# Patient Record
Sex: Male | Born: 1945 | Race: White | Hispanic: No | Marital: Married | Smoking: Never smoker
Health system: Southern US, Community
[De-identification: ages and names within clinical notes are randomized; demographics above are authoritative.]

## PROBLEM LIST (undated history)

## (undated) DIAGNOSIS — R972 Elevated prostate specific antigen [PSA]: Secondary | ICD-10-CM

## (undated) DIAGNOSIS — C61 Malignant neoplasm of prostate: Secondary | ICD-10-CM

## (undated) DIAGNOSIS — K5730 Diverticulosis of large intestine without perforation or abscess without bleeding: Secondary | ICD-10-CM

## (undated) DIAGNOSIS — Z86010 Personal history of colonic polyps: Secondary | ICD-10-CM

## (undated) DIAGNOSIS — R030 Elevated blood-pressure reading, without diagnosis of hypertension: Secondary | ICD-10-CM

## (undated) DIAGNOSIS — N400 Enlarged prostate without lower urinary tract symptoms: Secondary | ICD-10-CM

## (undated) DIAGNOSIS — R351 Nocturia: Secondary | ICD-10-CM

## (undated) DIAGNOSIS — I10 Essential (primary) hypertension: Secondary | ICD-10-CM

## (undated) DIAGNOSIS — Z8669 Personal history of other diseases of the nervous system and sense organs: Secondary | ICD-10-CM

## (undated) HISTORY — PX: COLONOSCOPY: SHX174

## (undated) HISTORY — DX: Personal history of colonic polyps: Z86.010

## (undated) HISTORY — PX: CATARACT EXTRACTION: SUR2

---

## 2005-03-02 ENCOUNTER — Ambulatory Visit: Payer: Self-pay | Admitting: Internal Medicine

## 2005-03-16 ENCOUNTER — Ambulatory Visit: Payer: Self-pay | Admitting: Internal Medicine

## 2005-12-12 ENCOUNTER — Emergency Department: Payer: Self-pay | Admitting: Internal Medicine

## 2008-04-19 HISTORY — PX: TRANSURETHRAL RESECTION OF PROSTATE: SHX73

## 2010-07-25 ENCOUNTER — Emergency Department (INDEPENDENT_AMBULATORY_CARE_PROVIDER_SITE_OTHER): Payer: Medicare Other

## 2010-07-25 ENCOUNTER — Emergency Department (HOSPITAL_BASED_OUTPATIENT_CLINIC_OR_DEPARTMENT_OTHER)
Admission: EM | Admit: 2010-07-25 | Discharge: 2010-07-26 | Disposition: A | Discharge status: Nursing Home (Includes ICF) | Payer: Medicare Other | Source: Home / Self Care | Attending: Emergency Medicine | Admitting: Emergency Medicine

## 2010-07-25 DIAGNOSIS — J3489 Other specified disorders of nose and nasal sinuses: Secondary | ICD-10-CM | POA: Insufficient documentation

## 2010-07-25 DIAGNOSIS — G459 Transient cerebral ischemic attack, unspecified: Secondary | ICD-10-CM | POA: Insufficient documentation

## 2010-07-25 DIAGNOSIS — R209 Unspecified disturbances of skin sensation: Secondary | ICD-10-CM | POA: Insufficient documentation

## 2010-07-25 DIAGNOSIS — R221 Localized swelling, mass and lump, neck: Secondary | ICD-10-CM

## 2010-07-25 DIAGNOSIS — R220 Localized swelling, mass and lump, head: Secondary | ICD-10-CM

## 2010-07-25 DIAGNOSIS — R29810 Facial weakness: Secondary | ICD-10-CM

## 2010-07-25 DIAGNOSIS — X58XXXA Exposure to other specified factors, initial encounter: Secondary | ICD-10-CM | POA: Insufficient documentation

## 2010-07-25 DIAGNOSIS — S058X9A Other injuries of unspecified eye and orbit, initial encounter: Secondary | ICD-10-CM | POA: Insufficient documentation

## 2010-07-25 DIAGNOSIS — H9209 Otalgia, unspecified ear: Secondary | ICD-10-CM | POA: Insufficient documentation

## 2010-07-25 LAB — BASIC METABOLIC PANEL
BUN: 17 mg/dL (ref 6–23)
CO2: 30 mEq/L (ref 19–32)
Chloride: 105 mEq/L (ref 96–112)
Potassium: 4.2 mEq/L (ref 3.5–5.1)

## 2010-07-25 LAB — CBC
HCT: 38 % — ABNORMAL LOW (ref 39.0–52.0)
MCH: 30 pg (ref 26.0–34.0)
MCHC: 34.2 g/dL (ref 30.0–36.0)
MCV: 87.6 fL (ref 78.0–100.0)
RDW: 12.5 % (ref 11.5–15.5)
WBC: 6 10*3/uL (ref 4.0–10.5)

## 2010-07-25 LAB — DIFFERENTIAL
Eosinophils Relative: 8 % — ABNORMAL HIGH (ref 0–5)
Lymphocytes Relative: 35 % (ref 12–46)
Lymphs Abs: 2.1 10*3/uL (ref 0.7–4.0)
Monocytes Absolute: 0.6 10*3/uL (ref 0.1–1.0)
Monocytes Relative: 10 % (ref 3–12)

## 2010-07-25 LAB — APTT: aPTT: 29 seconds (ref 24–37)

## 2010-07-26 ENCOUNTER — Emergency Department (HOSPITAL_COMMUNITY)
Admission: EM | Admit: 2010-07-26 | Discharge: 2010-07-26 | Disposition: A | Discharge status: Other Acute Inpatient Hospital | Payer: Medicare Other | Attending: Emergency Medicine | Admitting: Emergency Medicine

## 2010-07-26 ENCOUNTER — Other Ambulatory Visit (HOSPITAL_BASED_OUTPATIENT_CLINIC_OR_DEPARTMENT_OTHER): Payer: Self-pay

## 2010-07-26 ENCOUNTER — Emergency Department (HOSPITAL_COMMUNITY): Payer: Medicare Other

## 2010-07-26 DIAGNOSIS — H5789 Other specified disorders of eye and adnexa: Secondary | ICD-10-CM | POA: Insufficient documentation

## 2010-07-26 DIAGNOSIS — G459 Transient cerebral ischemic attack, unspecified: Secondary | ICD-10-CM | POA: Insufficient documentation

## 2010-07-26 DIAGNOSIS — G510 Bell's palsy: Secondary | ICD-10-CM | POA: Insufficient documentation

## 2010-07-26 DIAGNOSIS — I6529 Occlusion and stenosis of unspecified carotid artery: Secondary | ICD-10-CM | POA: Insufficient documentation

## 2010-07-26 DIAGNOSIS — R220 Localized swelling, mass and lump, head: Secondary | ICD-10-CM | POA: Insufficient documentation

## 2010-07-26 DIAGNOSIS — X58XXXA Exposure to other specified factors, initial encounter: Secondary | ICD-10-CM | POA: Insufficient documentation

## 2010-07-26 DIAGNOSIS — S058X9A Other injuries of unspecified eye and orbit, initial encounter: Secondary | ICD-10-CM | POA: Insufficient documentation

## 2010-07-26 DIAGNOSIS — I658 Occlusion and stenosis of other precerebral arteries: Secondary | ICD-10-CM | POA: Insufficient documentation

## 2010-07-26 DIAGNOSIS — R209 Unspecified disturbances of skin sensation: Secondary | ICD-10-CM | POA: Insufficient documentation

## 2010-07-26 DIAGNOSIS — R221 Localized swelling, mass and lump, neck: Secondary | ICD-10-CM | POA: Insufficient documentation

## 2010-07-26 DIAGNOSIS — R4789 Other speech disturbances: Secondary | ICD-10-CM | POA: Insufficient documentation

## 2010-07-26 DIAGNOSIS — H5710 Ocular pain, unspecified eye: Secondary | ICD-10-CM | POA: Insufficient documentation

## 2010-07-26 DIAGNOSIS — R29810 Facial weakness: Secondary | ICD-10-CM | POA: Insufficient documentation

## 2010-07-26 HISTORY — PX: TRANSTHORACIC ECHOCARDIOGRAM: SHX275

## 2010-07-26 LAB — CK TOTAL AND CKMB (NOT AT ARMC)
Relative Index: 1.9 (ref 0.0–2.5)
Total CK: 169 U/L (ref 7–232)

## 2010-07-26 LAB — COMPREHENSIVE METABOLIC PANEL
AST: 24 U/L (ref 0–37)
Albumin: 3.5 g/dL (ref 3.5–5.2)
Calcium: 8.9 mg/dL (ref 8.4–10.5)
Chloride: 106 mEq/L (ref 96–112)
Creatinine, Ser: 1.01 mg/dL (ref 0.4–1.5)
GFR calc Af Amer: >60 mL/min (ref >60)

## 2010-07-26 LAB — URINALYSIS, ROUTINE W REFLEX MICROSCOPIC
Glucose, UA: NEGATIVE mg/dL
Hgb urine dipstick: NEGATIVE
Ketones, ur: NEGATIVE mg/dL
pH: 7.5 (ref 5.0–8.0)

## 2010-07-26 LAB — LIPID PANEL
LDL Cholesterol: 139 mg/dL — ABNORMAL HIGH (ref 0–99)
Total CHOL/HDL Ratio: 3.5 RATIO
Triglycerides: 59 mg/dL (ref <150)
VLDL: 12 mg/dL (ref 0–40)

## 2010-07-26 MED ORDER — IOHEXOL 300 MG/ML  SOLN: 100.0000 mL | Freq: Once | INTRAMUSCULAR | Status: AC | PRN

## 2010-07-26 MED ORDER — IOHEXOL 300 MG/ML  SOLN: 100.0000 mL | Freq: Once | INTRAMUSCULAR | Status: DC | PRN

## 2011-09-24 ENCOUNTER — Other Ambulatory Visit: Payer: Self-pay | Admitting: Urology

## 2011-10-08 ENCOUNTER — Encounter (HOSPITAL_BASED_OUTPATIENT_CLINIC_OR_DEPARTMENT_OTHER): Payer: Self-pay | Admitting: *Deleted

## 2011-10-08 NOTE — Progress Notes (Signed)
NPO AFTER MN. ARRIVES AT 0645. NEEDS HG AND EKG. WILL DO FLEET ENEMA AM OF SURG.

## 2011-10-14 ENCOUNTER — Ambulatory Visit (HOSPITAL_BASED_OUTPATIENT_CLINIC_OR_DEPARTMENT_OTHER)
Admission: RE | Admit: 2011-10-14 | Discharge: 2011-10-14 | Disposition: A | Discharge status: Home / Self Care | Payer: Medicare Other | Source: Ambulatory Visit | Attending: Urology | Admitting: Urology

## 2011-10-14 ENCOUNTER — Ambulatory Visit (HOSPITAL_BASED_OUTPATIENT_CLINIC_OR_DEPARTMENT_OTHER): Payer: Medicare Other | Admitting: Anesthesiology

## 2011-10-14 ENCOUNTER — Encounter (HOSPITAL_BASED_OUTPATIENT_CLINIC_OR_DEPARTMENT_OTHER)
Admission: RE | Disposition: A | Discharge status: Home / Self Care | Payer: Self-pay | Source: Ambulatory Visit | Attending: Urology

## 2011-10-14 ENCOUNTER — Encounter (HOSPITAL_BASED_OUTPATIENT_CLINIC_OR_DEPARTMENT_OTHER): Payer: Self-pay | Admitting: Anesthesiology

## 2011-10-14 ENCOUNTER — Other Ambulatory Visit: Payer: Self-pay

## 2011-10-14 ENCOUNTER — Encounter (HOSPITAL_BASED_OUTPATIENT_CLINIC_OR_DEPARTMENT_OTHER): Payer: Self-pay | Admitting: *Deleted

## 2011-10-14 DIAGNOSIS — N400 Enlarged prostate without lower urinary tract symptoms: Secondary | ICD-10-CM | POA: Insufficient documentation

## 2011-10-14 DIAGNOSIS — R972 Elevated prostate specific antigen [PSA]: Secondary | ICD-10-CM | POA: Insufficient documentation

## 2011-10-14 DIAGNOSIS — L4050 Arthropathic psoriasis, unspecified: Secondary | ICD-10-CM

## 2011-10-14 DIAGNOSIS — R350 Frequency of micturition: Secondary | ICD-10-CM | POA: Insufficient documentation

## 2011-10-14 HISTORY — DX: Personal history of other diseases of the nervous system and sense organs: Z86.69

## 2011-10-14 HISTORY — PX: PROSTATE BIOPSY: SHX241

## 2011-10-14 HISTORY — DX: Elevated prostate specific antigen (PSA): R97.20

## 2011-10-14 SURGERY — BIOPSY, PROSTATE, RECTAL APPROACH, WITH US GUIDANCE
Anesthesia: Monitor Anesthesia Care | Site: Prostate | Wound class: Clean Contaminated

## 2011-10-14 MED ORDER — ACETAMINOPHEN 10 MG/ML IV SOLN: 1000.0000 mg | Freq: Four times a day (QID) | INTRAVENOUS | Status: DC

## 2011-10-14 MED ORDER — PROPOFOL 10 MG/ML IV EMUL
INTRAVENOUS | Status: DC | PRN
  Administered 2011-10-14 (×2): 20 mg via INTRAVENOUS

## 2011-10-14 MED ORDER — ACETAMINOPHEN 650 MG RE SUPP: 650.0000 mg | RECTAL | Status: DC | PRN

## 2011-10-14 MED ORDER — OXYCODONE HCL 5 MG PO TABS: 5.0000 mg | ORAL_TABLET | ORAL | Status: DC | PRN

## 2011-10-14 MED ORDER — MIDAZOLAM HCL 5 MG/5ML IJ SOLN
INTRAMUSCULAR | Status: DC | PRN
  Administered 2011-10-14: 2 mg via INTRAVENOUS
  Administered 2011-10-14 (×2): 1 mg via INTRAVENOUS

## 2011-10-14 MED ORDER — FENTANYL CITRATE 0.05 MG/ML IJ SOLN
INTRAMUSCULAR | Status: DC | PRN
  Administered 2011-10-14 (×4): 25 ug via INTRAVENOUS

## 2011-10-14 MED ORDER — KETOROLAC TROMETHAMINE 30 MG/ML IJ SOLN
INTRAMUSCULAR | Status: DC | PRN
  Administered 2011-10-14: 30 mg via INTRAVENOUS

## 2011-10-14 MED ORDER — CEFAZOLIN SODIUM 1-5 GM-% IV SOLN: 1.0000 g | INTRAVENOUS | Status: DC

## 2011-10-14 MED ORDER — ACETAMINOPHEN 325 MG PO TABS: 650.0000 mg | ORAL_TABLET | ORAL | Status: DC | PRN

## 2011-10-14 MED ORDER — LACTATED RINGERS IV SOLN: INTRAVENOUS | Status: DC

## 2011-10-14 MED ORDER — SODIUM CHLORIDE 0.45 % IV SOLN: INTRAVENOUS | Status: DC

## 2011-10-14 MED ORDER — ONDANSETRON HCL 4 MG/2ML IJ SOLN: 4.0000 mg | Freq: Four times a day (QID) | INTRAMUSCULAR | Status: DC | PRN

## 2011-10-14 MED ORDER — LACTATED RINGERS IV SOLN
INTRAVENOUS | Status: DC | PRN
  Administered 2011-10-14: 08:00:00 via INTRAVENOUS

## 2011-10-14 MED ORDER — PROMETHAZINE HCL 25 MG/ML IJ SOLN: 6.2500 mg | INTRAMUSCULAR | Status: DC | PRN

## 2011-10-14 MED ORDER — ONDANSETRON HCL 4 MG/2ML IJ SOLN
INTRAMUSCULAR | Status: DC | PRN
  Administered 2011-10-14: 4 mg via INTRAVENOUS

## 2011-10-14 MED ORDER — ACETAMINOPHEN 10 MG/ML IV SOLN
INTRAVENOUS | Status: DC | PRN
  Administered 2011-10-14: 1000 mg via INTRAVENOUS

## 2011-10-14 MED ORDER — SODIUM CHLORIDE 0.9 % IJ SOLN: 3.0000 mL | INTRAMUSCULAR | Status: DC | PRN

## 2011-10-14 MED ORDER — HYDROCODONE-ACETAMINOPHEN 7.5-650 MG PO TABS: 1.0000 | ORAL_TABLET | Freq: Four times a day (QID) | ORAL | Status: AC | PRN

## 2011-10-14 MED ORDER — PHENAZOPYRIDINE HCL 200 MG PO TABS: 200.0000 mg | ORAL_TABLET | Freq: Three times a day (TID) | ORAL | Status: AC | PRN

## 2011-10-14 MED ORDER — FENTANYL CITRATE 0.05 MG/ML IJ SOLN: 25.0000 ug | INTRAMUSCULAR | Status: DC | PRN

## 2011-10-14 MED ORDER — MEPERIDINE HCL 25 MG/ML IJ SOLN: 6.2500 mg | INTRAMUSCULAR | Status: DC | PRN

## 2011-10-14 MED ORDER — PROPOFOL 10 MG/ML IV EMUL
INTRAVENOUS | Status: DC | PRN
  Administered 2011-10-14: 100 ug/kg/min via INTRAVENOUS

## 2011-10-14 MED ORDER — SODIUM CHLORIDE 0.9 % IJ SOLN: 3.0000 mL | Freq: Two times a day (BID) | INTRAMUSCULAR | Status: DC

## 2011-10-14 MED ORDER — SODIUM CHLORIDE 0.9 % IV SOLN: 250.0000 mL | INTRAVENOUS | Status: DC | PRN

## 2011-10-14 SURGICAL SUPPLY — 10 items
DRESSING TELFA 8X3 (GAUZE/BANDAGES/DRESSINGS) ×2 IMPLANT
GLOVE BIO SURGEON STRL SZ7.5 (GLOVE) ×1 IMPLANT
GLOVE ECLIPSE 7.0 STRL STRAW (GLOVE) IMPLANT
GLOVE INDICATOR 7.5 STRL GRN (GLOVE) IMPLANT
IV NS IRRIG 3000ML ARTHROMATIC (IV SOLUTION) IMPLANT
SURGILUBE 2OZ TUBE FLIPTOP (MISCELLANEOUS) ×2 IMPLANT
TOWEL OR 17X24 6PK STRL BLUE (TOWEL DISPOSABLE) ×1 IMPLANT
UNDERPAD 30X30 INCONTINENT (UNDERPADS AND DIAPERS) ×4 IMPLANT
WATER STERILE IRR 3000ML UROMA (IV SOLUTION) IMPLANT
WATER STERILE IRR 500ML POUR (IV SOLUTION) ×1 IMPLANT

## 2011-10-14 NOTE — Op Note (Signed)
Pre-operative diagnosis : Elevated psa  Postoperative diagnosis:same  Operation:Transrectal u/s and biopsy  Surgeon:  S. Patsi Sears, MD  First assistant:none  Anesthesia: IV sedation  Preparation: After appropriate preanesthesia, the patient was brought to the operating room, and placed on the operating table in the left lateral decubitus position. IV sedation was given. Timeout was observed and armband was rechecked.  Review history: 63cc gland in pt with rising psa, now 16.   Statement of  Likelihood of Success: Excellent. TIME-OUT observed.:  Procedure: With the prostate ultrasound in position, ultrasound showed a 62.95 cc gland. A median lobe was identified.  12 core biopsies were then obtained without bleeding. The patient tolerated the procedure well. Tissue was sent to laboratory for examination. The patient received IV Tylenol prior to the procedure. He also received IV Ancef, 1 g.

## 2011-10-14 NOTE — Transfer of Care (Signed)
Immediate Anesthesia Transfer of Care Note  Patient: Jeremy Ruiz  Procedure(s) Performed: Procedure(s) (LRB): BIOPSY TRANSRECTAL ULTRASONIC PROSTATE (TUBP) (N/A)  Patient Location: PACU  Anesthesia Type: General  Level of Consciousness: awake, sedated, patient cooperative and responds to stimulation  Airway & Oxygen Therapy: Patient Spontanous Breathing and Patient connected to face mask oxygen  Post-op Assessment: Report given to PACU RN, Post -op Vital signs reviewed and stable and Patient moving all extremities  Post vital signs: Reviewed and stable  Complications: No apparent anesthesia complications

## 2011-10-14 NOTE — H&P (Signed)
  blems  1. PSA,Elevated 790.93 2. Urinary Frequency 788.41  History of Present Illness       66 yo married male Jeremy Ruiz, returns today for 6 mo f/u with a hx of elevated PSA.  Originally referred by Dr. Ilsa Iha for elevated PSA evaluation. Patient has an intermittent urinary flow & nocturia x 1-2.  He has had a diagnostic TURP in 2008. No family hx of CaP.  Today's IPSS = 2/7.  Current PSA 16.17. 02/24/11  PSA - 11.53/12%  08/26/10  PSA - 11.93/9%  08/03/10  PSA - 11.11  10/30/08  PSA - 10.22   Past Medical History Problems  1. History of  Arthritis V13.4  Surgical History Problems  1. History of  Transurethral Resection Of Prostate (TURP)  Current Meds 1. Multi-Day Vitamins TABS; TAKE 1 TABLET DAILY; Therapy: (Recorded:09May2012) to  Allergies Medication  1. No Known Drug Allergies  Family History Problems  1. Family history of  Family Health Status Number Of Children 1 son 2. Family history of  Father Deceased At Age ____ 81/Black Lung 3. Family history of  Mother Deceased At Age ____ 88/Stroke 4. Maternal history of  Stroke Syndrome V17.1  Social History Problems  1. Caffeine Use 4 per day 2. Marital History - Currently Married 3. Never A Smoker 4. 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 History of  Alcohol Use  Review of Systems Genitourinary, constitutional, skin, eye, otolaryngeal, hematologic/lymphatic, cardiovascular, pulmonary, endocrine, musculoskeletal, gastrointestinal, neurological and psychiatric system(s) were reviewed and pertinent findings if present are noted.    Vitals Vital Signs [Data Includes: Last 1 Day]  21May2013 10:31AM  BMI Calculated: 25.92 BSA Calculated: 1.91 Height: 5 ft 8 in Weight: 171 lb  Blood Pressure: 159 / 105 Temperature: 98 F Heart Rate: 71  Physical Exam Rectal: Rectal exam demonstrates normal sphincter tone, no tenderness and no masses. Estimated prostate size is 3+. Normal rectal tone, no rectal masses, prostate is smooth,  symmetric and non-tender. The prostate has no nodularity and is not tender. The left seminal vesicle is nonpalpable. The right seminal vesicle is nonpalpable. The perineum is normal on inspection.    Results/Data Urine [Data Includes: Last 1 Day]   21May2013  COLOR YELLOW   APPEARANCE CLEAR   SPECIFIC GRAVITY <1.005   pH 5.5   GLUCOSE NEG mg/dL  BILIRUBIN NEG   KETONE NEG mg/dL  BLOOD NEG   PROTEIN NEG mg/dL  UROBILINOGEN 0.2 mg/dL  NITRITE NEG   LEUKOCYTE ESTERASE NEG    Assessment Assessed  1. PSA,Elevated 790.93 2. Urinary Frequency 788.41   Rectal exam shows large but smooth gland. He is doing well.   Plan PSA,Elevated (790.93)  1. PSA  Requested for: 21May2013 2. PSA REFLEX TO FREE  Requested for: 21May2013 3. Follow-up Year x 1 Office  Follow-up  Requested for: 21May2013 Urinary Frequency (788.41), PSA,Elevated (790.93)  4. PSA REFLEX TO FREE  Requested for: 21May2013   RTC for psa 1 year.   Signatures

## 2011-10-14 NOTE — Anesthesia Postprocedure Evaluation (Signed)
  Anesthesia Post-op Note  Patient: Jeremy Ruiz  Procedure(s) Performed: Procedure(s) (LRB): BIOPSY TRANSRECTAL ULTRASONIC PROSTATE (TUBP) (N/A)  Patient Location: PACU  Anesthesia Type: MAC  Level of Consciousness: awake and alert   Airway and Oxygen Therapy: Patient Spontanous Breathing  Post-op Pain: mild  Post-op Assessment: Post-op Vital signs reviewed, Patient's Cardiovascular Status Stable, Respiratory Function Stable, Patent Airway and No signs of Nausea or vomiting  Post-op Vital Signs: stable  Complications: No apparent anesthesia complications

## 2011-10-14 NOTE — Anesthesia Preprocedure Evaluation (Addendum)
Anesthesia Evaluation  Patient identified by MRN, date of birth, ID band Patient awake    Reviewed: Allergy & Precautions, H&P , NPO status , Patient's Chart, lab work & pertinent test results  Airway Mallampati: II TM Distance: >3 FB Neck ROM: Full    Dental No notable dental hx.    Pulmonary neg pulmonary ROS,  breath sounds clear to auscultation  Pulmonary exam normal       Cardiovascular negative cardio ROS  Rhythm:Regular Rate:Normal     Neuro/Psych negative neurological ROS  negative psych ROS   GI/Hepatic negative GI ROS, Neg liver ROS,   Endo/Other  negative endocrine ROS  Renal/GU negative Renal ROS  negative genitourinary   Musculoskeletal negative musculoskeletal ROS (+)   Abdominal   Peds negative pediatric ROS (+)  Hematology negative hematology ROS (+)   Anesthesia Other Findings   Reproductive/Obstetrics negative OB ROS                           Anesthesia Physical Anesthesia Plan  ASA: I  Anesthesia Plan: MAC   Post-op Pain Management:    Induction: Intravenous  Airway Management Planned:   Additional Equipment:   Intra-op Plan:   Post-operative Plan:   Informed Consent: I have reviewed the patients History and Physical, chart, labs and discussed the procedure including the risks, benefits and alternatives for the proposed anesthesia with the patient or authorized representative who has indicated his/her understanding and acceptance.   Dental advisory given  Plan Discussed with: CRNA  Anesthesia Plan Comments:        Anesthesia Quick Evaluation

## 2011-10-14 NOTE — Interval H&P Note (Signed)
History and Physical Interval Note:  10/14/2011 8:11 AM  Jeremy Ruiz  has presented today for surgery, with the diagnosis of Elevated Prostate Specific Antigen  The various methods of treatment have been discussed with the patient and family. After consideration of risks, benefits and other options for treatment, the patient has consented to  Procedure(s) (LRB): BIOPSY TRANSRECTAL ULTRASONIC PROSTATE (TUBP) (N/A) as a surgical intervention .  The patient's history has been reviewed, patient examined, no change in status, stable for surgery.  I have reviewed the patients' chart and labs.  Questions were answered to the patient's satisfaction.     Jethro Bolus I

## 2011-10-14 NOTE — Progress Notes (Signed)
Pt's Hg 14.9

## 2011-10-14 NOTE — Discharge Instructions (Signed)

## 2011-10-15 ENCOUNTER — Encounter (HOSPITAL_BASED_OUTPATIENT_CLINIC_OR_DEPARTMENT_OTHER): Payer: Self-pay | Admitting: Urology

## 2011-10-19 ENCOUNTER — Encounter (HOSPITAL_BASED_OUTPATIENT_CLINIC_OR_DEPARTMENT_OTHER): Payer: Self-pay

## 2011-11-10 MED ORDER — CEFAZOLIN SODIUM 1-5 GM-% IV SOLN
INTRAVENOUS | Status: DC | PRN
  Administered 2011-10-14: 2 g via INTRAVENOUS

## 2011-11-10 NOTE — Addendum Note (Signed)
Addendum  created 11/10/11 1132 by Yaris Ferrell D Jashaun Penrose, CRNA   Modules edited:Anesthesia Events, Anesthesia Medication Administration    

## 2011-11-10 NOTE — Addendum Note (Signed)
Addendum  created 11/10/11 1132 by Jewel Baize Romie Tay, CRNA   Modules edited:Anesthesia Events, Anesthesia Medication Administration

## 2014-06-07 ENCOUNTER — Other Ambulatory Visit (HOSPITAL_COMMUNITY): Payer: Self-pay | Admitting: Urology

## 2014-06-07 DIAGNOSIS — R972 Elevated prostate specific antigen [PSA]: Secondary | ICD-10-CM

## 2014-06-21 ENCOUNTER — Ambulatory Visit (HOSPITAL_COMMUNITY)
Admission: RE | Admit: 2014-06-21 | Discharge: 2014-06-21 | Disposition: A | Discharge status: Home / Self Care | Payer: Medicare Other | Source: Ambulatory Visit | Attending: Urology | Admitting: Urology

## 2014-06-21 DIAGNOSIS — R972 Elevated prostate specific antigen [PSA]: Secondary | ICD-10-CM | POA: Diagnosis not present

## 2014-06-21 LAB — POCT I-STAT CREATININE: Creatinine, Ser: 1.1 mg/dL (ref 0.50–1.35)

## 2014-06-21 MED ORDER — GADOBENATE DIMEGLUMINE 529 MG/ML IV SOLN
20.0000 mL | Freq: Once | INTRAVENOUS | Status: AC | PRN
  Administered 2014-06-21: 16 mL via INTRAVENOUS

## 2015-03-31 ENCOUNTER — Encounter: Payer: Self-pay | Admitting: Internal Medicine

## 2015-04-22 ENCOUNTER — Encounter: Payer: Self-pay | Admitting: Internal Medicine

## 2015-06-02 ENCOUNTER — Ambulatory Visit (AMBULATORY_SURGERY_CENTER): Payer: Self-pay

## 2015-06-02 VITALS — Ht 68.0 in | Wt 170.0 lb

## 2015-06-02 DIAGNOSIS — Z1211 Encounter for screening for malignant neoplasm of colon: Secondary | ICD-10-CM

## 2015-06-02 NOTE — Progress Notes (Signed)
No egg or soy allergies Not on home 02 No previous anesthesia complications No diet or weight loss meds 

## 2015-06-11 DIAGNOSIS — I10 Essential (primary) hypertension: Secondary | ICD-10-CM | POA: Diagnosis not present

## 2015-06-11 DIAGNOSIS — N3001 Acute cystitis with hematuria: Secondary | ICD-10-CM | POA: Diagnosis not present

## 2015-06-11 DIAGNOSIS — R972 Elevated prostate specific antigen [PSA]: Secondary | ICD-10-CM | POA: Diagnosis not present

## 2015-06-12 ENCOUNTER — Telehealth: Payer: Self-pay | Admitting: Internal Medicine

## 2015-06-12 NOTE — Telephone Encounter (Signed)
Patient notified that he should keep his procedure as scheduled.  He is advised that as long as he does not have fever he may proceed as scheduled.

## 2015-06-13 ENCOUNTER — Telehealth: Payer: Self-pay | Admitting: Internal Medicine

## 2015-06-13 NOTE — Telephone Encounter (Signed)
No charge. 

## 2015-06-16 ENCOUNTER — Encounter: Payer: Medicare Other | Admitting: Internal Medicine

## 2015-07-03 DIAGNOSIS — H35371 Puckering of macula, right eye: Secondary | ICD-10-CM | POA: Diagnosis not present

## 2015-08-13 ENCOUNTER — Encounter: Payer: Medicare Other | Admitting: Internal Medicine

## 2015-08-21 ENCOUNTER — Telehealth: Payer: Self-pay | Admitting: Internal Medicine

## 2015-08-21 NOTE — Telephone Encounter (Signed)
Pt is scheduled for colonoscopy 09/11/14 with Dr. Carlean Purl. Pt had PV for procedure scheduled and cancelled for 2/27.  PV scheduled for 5/18 to review instructions for 5/25 colonoscopy.

## 2015-09-01 ENCOUNTER — Encounter: Payer: Medicare Other | Admitting: Internal Medicine

## 2015-09-04 ENCOUNTER — Ambulatory Visit (AMBULATORY_SURGERY_CENTER): Payer: Self-pay

## 2015-09-04 VITALS — Ht 68.0 in | Wt 168.6 lb

## 2015-09-04 DIAGNOSIS — Z1211 Encounter for screening for malignant neoplasm of colon: Secondary | ICD-10-CM

## 2015-09-04 NOTE — Progress Notes (Signed)
No allergies to eggs or soy No past problems with anesthesia No diet meds No home oxygen  Declined emmi 

## 2015-09-11 ENCOUNTER — Ambulatory Visit (AMBULATORY_SURGERY_CENTER): Payer: Medicare Other | Admitting: Internal Medicine

## 2015-09-11 ENCOUNTER — Encounter: Payer: Self-pay | Admitting: Internal Medicine

## 2015-09-11 VITALS — BP 133/78 | HR 60 | Temp 98.2°F | Resp 11 | Ht 68.0 in | Wt 168.0 lb

## 2015-09-11 DIAGNOSIS — I10 Essential (primary) hypertension: Secondary | ICD-10-CM | POA: Diagnosis not present

## 2015-09-11 DIAGNOSIS — D123 Benign neoplasm of transverse colon: Secondary | ICD-10-CM | POA: Diagnosis not present

## 2015-09-11 DIAGNOSIS — D120 Benign neoplasm of cecum: Secondary | ICD-10-CM

## 2015-09-11 DIAGNOSIS — Z1211 Encounter for screening for malignant neoplasm of colon: Secondary | ICD-10-CM | POA: Diagnosis not present

## 2015-09-11 DIAGNOSIS — D125 Benign neoplasm of sigmoid colon: Secondary | ICD-10-CM

## 2015-09-11 DIAGNOSIS — D122 Benign neoplasm of ascending colon: Secondary | ICD-10-CM | POA: Diagnosis not present

## 2015-09-11 MED ORDER — SODIUM CHLORIDE 0.9 % IV SOLN: 500.0000 mL | INTRAVENOUS | Status: DC

## 2015-09-11 NOTE — Op Note (Signed)
[facility] Patient Name: Jeremy Ruiz Procedure Date: 09/11/2015 9:55 AM MRN: [id number] Endoscopist: Gatha Mayer , MD Age: 70 Referring MD:  Date of Birth: 04-19-1946 Gender: Male Procedure:                Colonoscopy Indications:              Screening for colorectal malignant neoplasm (last                            colonoscopy was more than 10 years ago) Medicines:                Propofol per Anesthesia, Monitored Anesthesia Care Procedure:                Pre-Anesthesia Assessment:                           - Prior to the procedure, a History and Physical                            was performed, and patient medications and                            allergies were reviewed. The patient's tolerance of                            previous anesthesia was also reviewed. The risks                            and benefits of the procedure and the sedation                            options and risks were discussed with the patient.                            All questions were answered, and informed consent                            was obtained. Prior Anticoagulants: The patient has                            taken no previous anticoagulant or antiplatelet                            agents. ASA Grade Assessment: II - A patient with                            mild systemic disease. After reviewing the risks                            and benefits, the patient was deemed in                            satisfactory condition to undergo the procedure.  After obtaining informed consent, the colonoscope                            was passed under direct vision. Throughout the                            procedure, the patient's blood pressure, pulse, and                            oxygen saturations were monitored continuously. The                            Model CF-HQ190L [id number]) scope was introduced                            through the anus  and advanced to the the cecum,                            identified by appendiceal orifice and ileocecal                            valve. The colonoscopy was performed without                            difficulty. The patient tolerated the procedure                            well. The quality of the bowel preparation was                            excellent. The bowel preparation used was Miralax.                            The ileocecal valve, appendiceal orifice, and                            rectum were photographed. Scope In: 9:59:18 AM Scope Out: 10:15:20 AM Scope Withdrawal Time: 0 hours 14 minutes 23 seconds  Total Procedure Duration: 0 hours 16 minutes 2 seconds  Findings:                 The perianal examination was normal.                           The digital rectal exam findings include enlarged                            prostate. Pertinent negatives include no palpable                            rectal lesions.                           Two sessile polyps were found in the sigmoid colon  and ascending colon. The polyps were 4 to 10 mm in                            size. These polyps were removed with a cold snare.                            Resection and retrieval were complete. Verification                            of patient identification for the specimen was                            done. Estimated blood loss was minimal.                           Two sessile polyps were found in the transverse                            colon and cecum. The polyps were 2 mm in size.                            These polyps were removed with a cold biopsy                            forceps. Resection and retrieval were complete.                            Verification of patient identification for the                            specimen was done. Estimated blood loss was minimal.                           Many diverticula were found in the sigmoid colon.                             There was narrowing of the colon in association                            with the diverticular opening.                           The exam was otherwise without abnormality on                            direct and retroflexion views. Complications:            No immediate complications. Estimated Blood Loss:     Estimated blood loss was minimal. Impression:               - Enlarged prostate found on digital rectal exam.                           - Two 4 to 10  mm polyps in the sigmoid colon and in                            the ascending colon, removed with a cold snare.                            Resected and retrieved.                           - Two 2 mm polyps in the transverse colon and in                            the cecum, removed with a cold biopsy forceps.                            Resected and retrieved.                           - Severe diverticulosis in the sigmoid colon. There                            was narrowing of the colon in association with the                            diverticular opening.                           - The examination was otherwise normal on direct                            and retroflexion views.                           - Enlarged prostate found on digital rectal exam. Recommendation:           - Patient has a contact number available for                            emergencies. The signs and symptoms of potential                            delayed complications were discussed with the                            patient. Return to normal activities tomorrow.                            Written discharge instructions were provided to the                            patient.                           - Resume previous diet.                           -  Continue present medications.                           - Repeat colonoscopy is recommended. The                            colonoscopy date will be determined after  pathology                            results from today's exam become available for                            review. Gatha Mayer, MD 09/11/2015 10:22:50 AM This report has been signed electronically.

## 2015-09-11 NOTE — Progress Notes (Signed)
Report to PACU, RN, vss, BBS= Clear.  

## 2015-09-11 NOTE — Patient Instructions (Addendum)
I found and removed 4 polyps this time. You also have a condition called diverticulosis - common and not usually a problem. Please read the handout provided.  I will let you know pathology results and when to have another routine colonoscopy by mail.  I appreciate the opportunity to care for you. Gatha Mayer, MD, FACG  YOU HAD AN ENDOSCOPIC PROCEDURE TODAY AT [facility]:   Refer to the procedure report that was given to you for any specific questions about what was found during the examination.  If the procedure report does not answer your questions, please call your gastroenterologist to clarify.  If you requested that your care partner not be given the details of your procedure findings, then the procedure report has been included in a sealed envelope for you to review at your convenience later.  YOU SHOULD EXPECT: Some feelings of bloating in the abdomen. Passage of more gas than usual.  Walking can help get rid of the air that was put into your GI tract during the procedure and reduce the bloating. If you had a lower endoscopy (such as a colonoscopy or flexible sigmoidoscopy) you may notice spotting of blood in your stool or on the toilet paper. If you underwent a bowel prep for your procedure, you may not have a normal bowel movement for a few days.  Please Note:  You might notice some irritation and congestion in your nose or some drainage.  This is from the oxygen used during your procedure.  There is no need for concern and it should clear up in a day or so.  SYMPTOMS TO REPORT IMMEDIATELY:   Following lower endoscopy (colonoscopy or flexible sigmoidoscopy):  Excessive amounts of blood in the stool  Significant tenderness or worsening of abdominal pains  Swelling of the abdomen that is new, acute  Fever of 100F or higher  For urgent or emergent issues, a gastroenterologist can be reached at any hour by calling [phone].  DIET: Your first meal  following the procedure should be a small meal and then it is ok to progress to your normal diet. Heavy or fried foods are harder to digest and may make you feel nauseous or bloated.  Likewise, meals heavy in dairy and vegetables can increase bloating.  Drink plenty of fluids but you should avoid alcoholic beverages for 24 hours.  ACTIVITY:  You should plan to take it easy for the rest of today and you should NOT DRIVE or use heavy machinery until tomorrow (because of the sedation medicines used during the test).    FOLLOW UP: Our staff will call the number listed on your records the next business day following your procedure to check on you and address any questions or concerns that you may have regarding the information given to you following your procedure. If we do not reach you, we will leave a message.  However, if you are feeling well and you are not experiencing any problems, there is no need to return our call.  We will assume that you have returned to your regular daily activities without incident.  If any biopsies were taken you will be contacted by phone or by letter within the next 1-3 weeks.  Please call us at [phone] if you have not heard about the biopsies in 3 weeks.   SIGNATURES/CONFIDENTIALITY: You and/or your care partner have signed paperwork which will be entered into your electronic medical record.  These signatures attest to the fact  that that the information above on your After Visit Summary has been reviewed and is understood.  Full responsibility of the confidentiality of this discharge information lies with you and/or your care-partner.  Please read over handouts about polyps and diverticulosis  Please continue your normal medications  Await pathology

## 2015-09-11 NOTE — Progress Notes (Signed)
Called to room to assist during endoscopic procedure.  Patient ID and intended procedure confirmed with present staff. Received instructions for my participation in the procedure from the performing physician.  

## 2015-09-12 ENCOUNTER — Telehealth: Payer: Self-pay | Admitting: *Deleted

## 2015-09-12 DIAGNOSIS — Z860101 Personal history of adenomatous and serrated colon polyps: Secondary | ICD-10-CM

## 2015-09-12 DIAGNOSIS — Z86010 Personal history of colonic polyps: Secondary | ICD-10-CM | POA: Insufficient documentation

## 2015-09-12 HISTORY — DX: Personal history of colonic polyps: Z86.010

## 2015-09-12 HISTORY — DX: Personal history of adenomatous and serrated colon polyps: Z86.0101

## 2015-09-12 NOTE — Telephone Encounter (Signed)
  Follow up Call-  Call back number 09/11/2015  Post procedure Call Back phone  # #[phone] hm  Permission to leave phone message Yes     Patient questions:  Do you have a fever, pain , or abdominal swelling? No. Pain Score  0 *  Have you tolerated food without any problems? Yes.    Have you been able to return to your normal activities? Yes.    Do you have any questions about your discharge instructions: Diet   No. Medications  No. Follow up visit  No.  Do you have questions or concerns about your Care? No.  Actions: * If pain score is 4 or above: No action needed, pain <4.

## 2015-09-22 ENCOUNTER — Encounter: Payer: Self-pay | Admitting: Internal Medicine

## 2015-09-22 NOTE — Progress Notes (Signed)
Quick Note:  2 adenomas + ssp max size 10 mm recall 2020 ______

## 2016-02-13 DIAGNOSIS — D225 Melanocytic nevi of trunk: Secondary | ICD-10-CM | POA: Diagnosis not present

## 2016-02-13 DIAGNOSIS — L821 Other seborrheic keratosis: Secondary | ICD-10-CM | POA: Diagnosis not present

## 2016-02-13 DIAGNOSIS — Z1283 Encounter for screening for malignant neoplasm of skin: Secondary | ICD-10-CM | POA: Diagnosis not present

## 2016-03-01 DIAGNOSIS — R05 Cough: Secondary | ICD-10-CM | POA: Diagnosis not present

## 2016-03-09 DIAGNOSIS — R972 Elevated prostate specific antigen [PSA]: Secondary | ICD-10-CM | POA: Diagnosis not present

## 2016-03-10 ENCOUNTER — Other Ambulatory Visit: Payer: Self-pay | Admitting: Urology

## 2016-03-10 DIAGNOSIS — R972 Elevated prostate specific antigen [PSA]: Secondary | ICD-10-CM

## 2016-03-29 ENCOUNTER — Ambulatory Visit
Admission: RE | Admit: 2016-03-29 | Discharge: 2016-03-29 | Disposition: A | Discharge status: Home / Self Care | Payer: Medicare Other | Source: Ambulatory Visit | Attending: Urology | Admitting: Urology

## 2016-03-29 DIAGNOSIS — R972 Elevated prostate specific antigen [PSA]: Secondary | ICD-10-CM | POA: Diagnosis not present

## 2016-03-29 MED ORDER — GADOBENATE DIMEGLUMINE 529 MG/ML IV SOLN
15.0000 mL | Freq: Once | INTRAVENOUS | Status: AC | PRN
  Administered 2016-03-29: 15 mL via INTRAVENOUS

## 2016-04-27 ENCOUNTER — Other Ambulatory Visit: Payer: Self-pay | Admitting: Urology

## 2016-04-28 ENCOUNTER — Other Ambulatory Visit (HOSPITAL_COMMUNITY): Payer: Self-pay | Admitting: Urology

## 2016-04-28 DIAGNOSIS — R972 Elevated prostate specific antigen [PSA]: Secondary | ICD-10-CM

## 2016-05-05 ENCOUNTER — Encounter (HOSPITAL_BASED_OUTPATIENT_CLINIC_OR_DEPARTMENT_OTHER): Payer: Self-pay | Admitting: *Deleted

## 2016-05-05 NOTE — Progress Notes (Addendum)
NPO AFTER MN.  ARRIVE AT 0915.  NEEDS ISTAT 8 AND EKG.  WILL DO FLEET ENEMA AM DOS.

## 2016-05-06 NOTE — H&P (Signed)
Office Visit Report     03/09/2016   --------------------------------------------------------------------------------   Otila Back. Bivens  MRN: [id number]  PRIMARY CARE:  Thornell Mule, MD  DOB: Jul 06, 1945, 71 year old Male  REFERRING:    SSN: [id number]  PROVIDER:  Carolan Clines, M.D.    LOCATION:  [facility] [phone]   --------------------------------------------------------------------------------   CC: Elevated PSA-Established Patient  HPI: Jeremy Ruiz is a 71 year-old male established patient who is here for follow up for further evaluation of his elevated PSA.  His last PSA was performed 03/02/2016. The last PSA value was 35.92. The patient states he does not take 5 alpha reductase inhibitor medication.   He has had a prostate biopsy done. Patient does not have a family history of prostate cancer. The patient complains of lower urinary tract symptom(s) that include frequency, urgency, hesitancy, weak stream, intermittency, straining, nocturia, and sense of incomplete emptying.   MRI on 06/21/14 showed a 50mm area in the Lt lateral mid that was abnormal. He is s/p prostate biopsy on 10/14/11 with pathology showing inflammation & BPH. He has had 4 negative biopsies, and has psa 11.53 in 2012, 16.17 in 2013, and 18.29 in 3/15. He has 4K score of 33% chance of CaP.      CC: I have an enlarged prostate (S/P Surgery).  HPI: He has had a TURP for treatment of his lower urinary tract symptoms due to his BPH. It was performed approximately 02/18/2007. He is not on new medications for symptoms of prostate enlargement.   He does not have a good size and strength to his urinary stream. He does have hesitancy or straining. He is having problems with emptying his bladder well.     AUA Symptom Score: Less than 20% of the time he has the sensation of not emptying his bladder completely when finished urinating. 50% of the time he has to urinate again fewer than two hours  after he has finished urinating. Less than 50% of the time he has to start and stop again several times when he urinates. 50% of the time he finds it difficult to postpone urination. Less than 50% of the time he has a weak urinary stream. Less than 20% of the time he has to push or strain to begin urination. He has to get up to urinate 2 times from the time he goes to bed until the time he gets up in the morning.   Calculated AUA Symptom Score: 14    QOL Score: He would feel mostly satisfied if he had to live with his urinary condition the way it is now for the rest of his life.   Calculated QOL Symptom Score: 2    ALLERGIES: No Allergies    MEDICATIONS: HydroCHLOROthiazide TABS Oral  Multi-Day Vitamins TABS 1 Oral Daily     GU PSH: Cystoscopy TURP - 2012 Prostate Needle Biopsy - 2013, 2013    NON-GU PSH: None   GU PMH: Elevated PSA, Elevated prostate specific antigen (PSA) - 01/22/2015 Urinary Frequency, Increased urinary frequency - 01/22/2015      PMH Notes:  2010-08-26 13:07:20 - Note: Arthritis   NON-GU PMH: Encounter for general adult medical examination without abnormal findings, Encounter for preventive health examination - 2015    FAMILY HISTORY: Father Deceased At Age79 ___ - Runs In Family Mother Deceased At Age 33 from diabetic complicati - Runs In Family Stroke Syndrome - Mother   SOCIAL HISTORY: Marital Status: Married Current Smoking  Status: Patient has never smoked.  Has never drank.  Drinks 4+ caffeinated drinks per day. Patient's occupation Engineer, maintenance.     Notes: 1 son   REVIEW OF SYSTEMS:    GU Review Male:   Patient reports frequent urination and get up at night to urinate. Patient denies hard to postpone urination, burning/ pain with urination, leakage of urine, stream starts and stops, trouble starting your stream, have to strain to urinate , erection problems, and penile pain.  Gastrointestinal (Upper):   Patient denies nausea, vomiting, and  indigestion/ heartburn.  Gastrointestinal (Lower):   Patient denies diarrhea and constipation.  Constitutional:   Patient denies weight loss, fever, fatigue, and night sweats.  Skin:   Patient denies skin rash/ lesion and itching.  Eyes:   Patient denies blurred vision and double vision.  Ears/ Nose/ Throat:   Patient reports sinus problems. Patient denies sore throat.  Hematologic/Lymphatic:   Patient denies swollen glands and easy bruising.  Cardiovascular:   Patient denies leg swelling and chest pains.  Respiratory:   Patient denies cough and shortness of breath.  Endocrine:   Patient denies excessive thirst.  Musculoskeletal:   Patient denies back pain and joint pain.  Neurological:   Patient denies headaches and dizziness.  Psychologic:   Patient denies depression and anxiety.   VITAL SIGNS:      03/09/2016 03:33 PM  Weight 171 lb / 77.56 kg  Height 68 in / 172.72 cm  BP 137/77 mmHg  Pulse 73 /min  BMI 26.0 kg/m   PAST DATA REVIEWED:  Source Of History:  Patient  Records Review:   AUA Symptom Score, Previous Patient Records   01/16/15 01/16/15 05/14/14 06/20/13 09/07/11 02/24/11 08/26/10  PSA  Total PSA 23.81  23.81  19.65  18.29  16.17  11.53  11.93   Free PSA      1.43  1.1   % Free PSA      12  9     PROCEDURES:          Urinalysis Dipstick Dipstick Cont'd  Color: Yellow Bilirubin: Neg  Appearance: Clear Ketones: Neg  Specific Gravity: 1.010 Blood: Neg  pH: 7.0 Protein: Neg  Glucose: Neg Urobilinogen: 0.2    Nitrites: Neg    Leukocyte Esterase: Neg    ASSESSMENT:      ICD-10 Details  1 GU:   Elevated PSA - R97.20   2   BPH w/LUTS - N40.1               Notes:   PSA continues to rise. Current PSA is 5.92. He will have them [facility], and we will compare MRI with MRI last year. If it is abnormal, he will have prostate ultrasound, and then targeted biopsy.    PLAN:           Orders         Schedule X-Rays: 1 Week - MRI Prostate GSORAD With  and Without I.V. Contrast - next available    MRI: New, enlarged area of multiparametric signal abnormality involving the anterior left peripheral zone, mid to apex. Suspicious for high volume, high grade disease PI-RADSv2-4.  ( Dr. Jobe Igo).  No evidence of metastatic disease.    Pt unable to tolerate office procedure, and needs anesthesia for bx, so will proceed with 12 pack bx with additional augmented targeted bxs per MRI direction.         Document Letter(s):  Created for Patient: Clinical Summary  Notes:   cc: Dr. Roswell Miners, [facility]        The information contained in this medical record document is considered private and confidential patient information. This information can only be used for the medical diagnosis and/or medical services that are being provided by the patient's selected caregivers. This information can only be distributed outside of the patient's care if the patient agrees and signs waivers of authorization for this information to be sent to an outside source or route.

## 2016-05-07 ENCOUNTER — Ambulatory Visit (HOSPITAL_COMMUNITY)
Admission: RE | Admit: 2016-05-07 | Discharge: 2016-05-07 | Disposition: A | Discharge status: Home / Self Care | Payer: Medicare Other | Source: Ambulatory Visit | Attending: Urology | Admitting: Urology

## 2016-05-07 ENCOUNTER — Encounter (HOSPITAL_BASED_OUTPATIENT_CLINIC_OR_DEPARTMENT_OTHER): Payer: Self-pay

## 2016-05-07 ENCOUNTER — Ambulatory Visit (HOSPITAL_BASED_OUTPATIENT_CLINIC_OR_DEPARTMENT_OTHER): Payer: Medicare Other | Admitting: Anesthesiology

## 2016-05-07 ENCOUNTER — Encounter (HOSPITAL_BASED_OUTPATIENT_CLINIC_OR_DEPARTMENT_OTHER)
Admission: RE | Disposition: A | Discharge status: Home / Self Care | Payer: Self-pay | Source: Ambulatory Visit | Attending: Urology

## 2016-05-07 DIAGNOSIS — C61 Malignant neoplasm of prostate: Secondary | ICD-10-CM | POA: Diagnosis not present

## 2016-05-07 DIAGNOSIS — R972 Elevated prostate specific antigen [PSA]: Secondary | ICD-10-CM

## 2016-05-07 HISTORY — DX: Elevated blood-pressure reading, without diagnosis of hypertension: R03.0

## 2016-05-07 HISTORY — DX: Benign prostatic hyperplasia without lower urinary tract symptoms: N40.0

## 2016-05-07 HISTORY — PX: PROSTATE BIOPSY: SHX241

## 2016-05-07 HISTORY — DX: Nocturia: R35.1

## 2016-05-07 HISTORY — DX: Diverticulosis of large intestine without perforation or abscess without bleeding: K57.30

## 2016-05-07 LAB — POCT I-STAT, CHEM 8
BUN: 23 mg/dL — AB (ref 6–20)
CALCIUM ION: 1.26 mmol/L (ref 1.15–1.40)
CREATININE: 1 mg/dL (ref 0.61–1.24)
Chloride: 97 mmol/L — ABNORMAL LOW (ref 101–111)
Glucose, Bld: 97 mg/dL (ref 65–99)
HCT: 40 % (ref 39.0–52.0)
Hemoglobin: 13.6 g/dL (ref 13.0–17.0)
Potassium: 3.9 mmol/L (ref 3.5–5.1)
Sodium: 140 mmol/L (ref 135–145)
TCO2: 30 mmol/L (ref 0–100)

## 2016-05-07 SURGERY — BIOPSY, PROSTATE
Anesthesia: General | Site: Prostate

## 2016-05-07 MED ORDER — FENTANYL CITRATE (PF) 100 MCG/2ML IJ SOLN
INTRAMUSCULAR | Status: AC
  Filled 2016-05-07: qty 2

## 2016-05-07 MED ORDER — LIDOCAINE 2% (20 MG/ML) 5 ML SYRINGE
INTRAMUSCULAR | Status: DC | PRN
  Administered 2016-05-07: 80 mg via INTRAVENOUS

## 2016-05-07 MED ORDER — PROPOFOL 10 MG/ML IV BOLUS
INTRAVENOUS | Status: DC | PRN
  Administered 2016-05-07: 160 mg via INTRAVENOUS

## 2016-05-07 MED ORDER — PROPOFOL 10 MG/ML IV BOLUS
INTRAVENOUS | Status: AC
  Filled 2016-05-07: qty 20

## 2016-05-07 MED ORDER — LEVOFLOXACIN 500 MG PO TABS: 500.0000 mg | ORAL_TABLET | Freq: Every day | ORAL | 0 refills | Status: DC

## 2016-05-07 MED ORDER — BELLADONNA ALKALOIDS-OPIUM 16.2-60 MG RE SUPP
RECTAL | Status: AC
  Filled 2016-05-07: qty 1

## 2016-05-07 MED ORDER — DEXAMETHASONE SODIUM PHOSPHATE 4 MG/ML IJ SOLN
INTRAMUSCULAR | Status: DC | PRN
  Administered 2016-05-07: 4 mg via INTRAVENOUS

## 2016-05-07 MED ORDER — LACTATED RINGERS IV SOLN
INTRAVENOUS | Status: DC
  Administered 2016-05-07 (×2): via INTRAVENOUS
  Filled 2016-05-07: qty 1000

## 2016-05-07 MED ORDER — ONDANSETRON HCL 4 MG/2ML IJ SOLN
INTRAMUSCULAR | Status: AC
  Filled 2016-05-07: qty 2

## 2016-05-07 MED ORDER — DEXAMETHASONE SODIUM PHOSPHATE 10 MG/ML IJ SOLN
INTRAMUSCULAR | Status: AC
  Filled 2016-05-07: qty 1

## 2016-05-07 MED ORDER — FENTANYL CITRATE (PF) 100 MCG/2ML IJ SOLN
INTRAMUSCULAR | Status: DC | PRN
  Administered 2016-05-07: 50 ug via INTRAVENOUS

## 2016-05-07 MED ORDER — PHENYLEPHRINE 40 MCG/ML (10ML) SYRINGE FOR IV PUSH (FOR BLOOD PRESSURE SUPPORT)
PREFILLED_SYRINGE | INTRAVENOUS | Status: DC | PRN
  Administered 2016-05-07 (×5): 40 ug via INTRAVENOUS

## 2016-05-07 MED ORDER — EPHEDRINE 5 MG/ML INJ
INTRAVENOUS | Status: AC
  Filled 2016-05-07: qty 10

## 2016-05-07 MED ORDER — ONDANSETRON HCL 4 MG/2ML IJ SOLN
INTRAMUSCULAR | Status: DC | PRN
  Administered 2016-05-07: 4 mg via INTRAVENOUS

## 2016-05-07 MED ORDER — PHENYLEPHRINE 40 MCG/ML (10ML) SYRINGE FOR IV PUSH (FOR BLOOD PRESSURE SUPPORT)
PREFILLED_SYRINGE | INTRAVENOUS | Status: AC
  Filled 2016-05-07: qty 10

## 2016-05-07 MED ORDER — CEFAZOLIN SODIUM-DEXTROSE 2-4 GM/100ML-% IV SOLN
INTRAVENOUS | Status: AC
  Filled 2016-05-07: qty 100

## 2016-05-07 MED ORDER — EPHEDRINE SULFATE-NACL 50-0.9 MG/10ML-% IV SOSY
PREFILLED_SYRINGE | INTRAVENOUS | Status: DC | PRN
  Administered 2016-05-07 (×5): 10 mg via INTRAVENOUS

## 2016-05-07 MED ORDER — CEFAZOLIN SODIUM-DEXTROSE 2-4 GM/100ML-% IV SOLN
2.0000 g | INTRAVENOUS | Status: AC
  Administered 2016-05-07: 2 g via INTRAVENOUS
  Filled 2016-05-07: qty 100

## 2016-05-07 MED ORDER — LIDOCAINE 2% (20 MG/ML) 5 ML SYRINGE
INTRAMUSCULAR | Status: AC
  Filled 2016-05-07: qty 5

## 2016-05-07 MED ORDER — TRAMADOL-ACETAMINOPHEN 37.5-325 MG PO TABS: 1.0000 | ORAL_TABLET | Freq: Four times a day (QID) | ORAL | 0 refills | Status: DC | PRN

## 2016-05-07 SURGICAL SUPPLY — 8 items
DRSG TELFA 3X8 NADH (GAUZE/BANDAGES/DRESSINGS) IMPLANT
GLOVE BIO SURGEON STRL SZ7.5 (GLOVE) ×2 IMPLANT
INST BIOPSY MAXCORE 18GX25 (NEEDLE) ×1 IMPLANT
INSTR BIOPSY MAXCORE 18GX20 (NEEDLE) IMPLANT
KIT ROOM TURNOVER WOR (KITS) ×2 IMPLANT
SURGILUBE 2OZ TUBE FLIPTOP (MISCELLANEOUS) ×2 IMPLANT
TOWEL OR 17X24 6PK STRL BLUE (TOWEL DISPOSABLE) ×2 IMPLANT
UNDERPAD 30X30 INCONTINENT (UNDERPADS AND DIAPERS) ×4 IMPLANT

## 2016-05-07 NOTE — Interval H&P Note (Signed)
History and Physical Interval Note:  05/07/2016 11:02 AM  Jeremy Ruiz  has presented today for surgery, with the diagnosis of ELEVATED PSA  The various methods of treatment have been discussed with the patient and family. After consideration of risks, benefits and other options for treatment, the patient has consented to  Procedure(s): SATURATED PROSTATE BIOPSY (N/A) as a surgical intervention .  The patient's history has been reviewed, patient examined, no change in status, stable for surgery.  I have reviewed the patient's chart and labs.  Questions were answered to the patient's satisfaction.     Elmyra Banwart I Brithney Bensen

## 2016-05-07 NOTE — Anesthesia Procedure Notes (Signed)
Procedure Name: LMA Insertion Date/Time: 05/07/2016 11:11 AM Performed by: Denna Haggard D Pre-anesthesia Checklist: Patient identified, Emergency Drugs available, Suction available and Patient being monitored Patient Re-evaluated:Patient Re-evaluated prior to inductionOxygen Delivery Method: Circle system utilized Preoxygenation: Pre-oxygenation with 100% oxygen Intubation Type: IV induction Ventilation: Mask ventilation without difficulty LMA: LMA inserted LMA Size: 4.0 Number of attempts: 1 Airway Equipment and Method: Bite block Placement Confirmation: positive ETCO2 Tube secured with: Tape Dental Injury: Teeth and Oropharynx as per pre-operative assessment

## 2016-05-07 NOTE — Transfer of Care (Signed)
Immediate Anesthesia Transfer of Care Note  Patient: Jeremy Ruiz  Procedure(s) Performed: Procedure(s) (LRB): SATURATED PROSTATE BIOPSY (N/A)  Patient Location: PACU  Anesthesia Type: General  Level of Consciousness: awake, oriented, sedated and patient cooperative  Airway & Oxygen Therapy: Patient Spontanous Breathing and Patient connected to face mask oxygen  Post-op Assessment: Report given to PACU RN and Post -op Vital signs reviewed and stable  Post vital signs: Reviewed and stable  Complications: No apparent anesthesia complications

## 2016-05-07 NOTE — Discharge Instructions (Addendum)
Call your surgeon if you experience:   1.  Fever over 101.0. 2.  Inability to urinate. 3.  Nausea and/or vomiting. 4.  Extreme swelling or bruising at the surgical site. 5.  Continued bleeding from the incision. 6.  Increased pain, redness or drainage from the incision. 7.  Problems related to your pain medication. 8.  Any problems and/or concerns Post Anesthesia Home Care Instructions  Activity: Get plenty of rest for the remainder of the day. A responsible adult should stay with you for 24 hours following the procedure.  For the next 24 hours, DO NOT: -Drive a car -Paediatric nurse -Drink alcoholic beverages -Take any medication unless instructed by your physician -Make any legal decisions or sign important papers.  Meals: Start with liquid foods such as gelatin or soup. Progress to regular foods as tolerated. Avoid greasy, spicy, heavy foods. If nausea and/or vomiting occur, drink only clear liquids until the nausea and/or vomiting subsides. Call your physician if vomiting continues.  Special Instructions/Symptoms: Your throat may feel dry or sore from the anesthesia or the breathing tube placed in your throat during surgery. If this causes discomfort, gargle with warm salt water. The discomfort should disappear within 24 hours.  If you had a scopolamine patch placed behind your ear for the management of post- operative nausea and/or vomiting:  1. The medication in the patch is effective for 72 hours, after which it should be removed.  Wrap patch in a tissue and discard in the trash. Wash hands thoroughly with soap and water. 2. You may remove the patch earlier than 72 hours if you experience unpleasant side effects which may include dry mouth, dizziness or visual disturbances. 3. Avoid touching the patch. Wash your hands with soap and water after contact with the patch.   Transrectal Ultrasound-Guided Biopsy A transrectal ultrasound-guided biopsy is a procedure to take samples  of tissue from your prostate. Ultrasound images are used to guide the procedure. It is usually done to check the prostate gland for cancer. What happens before the procedure?  Do not eat or drink after midnight on the night before your procedure.  Take medicines as your doctor tells you.  Your doctor may have you stop taking some medicines 5-7 days before the procedure.  You will be given an enema before your procedure. During an enema, a liquid is put into your butt (rectum) to clear out waste.  You may have lab tests the day of your procedure.  Make plans to have someone drive you home. What happens during the procedure?  You will be given medicine to help you relax before the procedure. An IV tube will be put into one of your veins. It will be used to give fluids and medicine.  You will be given medicine to reduce the risk of infection (antibiotic).  You will be placed on your side.  A probe with gel will be put in your butt. This is used to take pictures of your prostate and the area around it.  A medicine to numb the area is put into your prostate.  A biopsy needle is then inserted and guided to your prostate.  Samples of prostate tissue are taken. The needle is removed.  The samples are sent to a lab to be checked. Results are usually back in 2-3 days. What happens after the procedure?  You will be taken to a room where you will be watched until you are doing okay.  You may have some pain in  the area around your butt. You will be given medicines for this.  You may be able to go home the same day. Sometimes, an overnight stay in the hospital is needed. This information is not intended to replace advice given to you by your health care provider. Make sure you discuss any questions you have with your health care provider. Document Released: 03/24/2009 Document Revised: 09/11/2015 Document Reviewed: 11/22/2012 Elsevier Interactive Patient Education  2017 [employer].

## 2016-05-07 NOTE — Op Note (Signed)
Pre-operative diagnosis :  PSA elevation with abnormal MRI  Postoperative diagnosis:  Same  Operation: Saturation prostate biopsies x 37, with  4 biopsies through Left mid-apical PI-RADS 4 MRI nodule.    Surgeon:  Chauncey Cruel. Gaynelle Arabian, MD  First assistant: None  Anesthesia:  IV sedation  Preparation: After appropriate pre-anesthesia, the patient brought to the operating, placed on the operating table in the dorsal supine position where IV sedation anesthesia was accomplished. He was repositioned in the left lateral decubitus position, where the history was reviewed, and the arm and was double checked. Review history:  Jeremy Ruiz is a 71 year-old male established patient who is here for follow up for further evaluation of his elevated PSA.  His last PSA was performed 03/02/2016. The last PSA value was 35.92. The patient states he does not take 5 alpha reductase inhibitor medication.   He has had a prostate biopsy done. Patient does not have a family history of prostate cancer. The patient complains of lower urinary tract symptom(s) that include frequency, urgency, hesitancy, weak stream, intermittency, straining, nocturia, and sense of incomplete emptying.   MRI on 06/21/14 showed a 21mm area in the Lt lateral mid that was abnormal. He is s/p prostate biopsy on 10/14/11 with pathology showing inflammation & BPH. He has had 4 negative biopsies, and has psa 11.53 in 2012, 16.17 in 2013, and 18.29 in 3/15. He has 4K score of 33% chance of CaP. MRI now shows L prostatic nodule with PI-RADS-4 area, for saturation biopsies.     Statement of  Likelihood of Success: Excellent. TIME-OUT observed.:  Procedure: Prostate ultrasonography was accomplished, showing a 63 mL prostate.  Saturation biopsies were accomplished, with 3 biopsies through each traditional 12 pack zone, and 4 biopsies through the MRI designated abnormal area, in the left mid to apex. The patient had minimal bleeding. He tolerated the  biopsy well. He was replaced in the supine position, where he was awakened, and taken to recovery room in good condition.

## 2016-05-07 NOTE — Anesthesia Preprocedure Evaluation (Signed)
Anesthesia Evaluation  Patient identified by MRN, date of birth, ID band Patient awake    Reviewed: Allergy & Precautions, NPO status , Patient's Chart, lab work & pertinent test results  Airway Mallampati: II  TM Distance: >3 FB Neck ROM: Full    Dental no notable dental hx.    Pulmonary neg pulmonary ROS,    Pulmonary exam normal breath sounds clear to auscultation       Cardiovascular negative cardio ROS Normal cardiovascular exam Rhythm:Regular Rate:Normal     Neuro/Psych negative neurological ROS  negative psych ROS   GI/Hepatic negative GI ROS, Neg liver ROS,   Endo/Other  negative endocrine ROS  Renal/GU negative Renal ROS  negative genitourinary   Musculoskeletal negative musculoskeletal ROS (+)   Abdominal   Peds negative pediatric ROS (+)  Hematology negative hematology ROS (+)   Anesthesia Other Findings   Reproductive/Obstetrics negative OB ROS                             Anesthesia Physical Anesthesia Plan  ASA: II  Anesthesia Plan: General   Post-op Pain Management:    Induction: Intravenous  Airway Management Planned: LMA  Additional Equipment:   Intra-op Plan:   Post-operative Plan: Extubation in OR  Informed Consent: I have reviewed the patients History and Physical, chart, labs and discussed the procedure including the risks, benefits and alternatives for the proposed anesthesia with the patient or authorized representative who has indicated his/her understanding and acceptance.   Dental advisory given  Plan Discussed with: CRNA and Surgeon  Anesthesia Plan Comments:         Anesthesia Quick Evaluation  

## 2016-05-07 NOTE — Anesthesia Postprocedure Evaluation (Addendum)
Anesthesia Post Note  Patient: Jeremy Ruiz  Procedure(s) Performed: Procedure(s) (LRB): SATURATED PROSTATE BIOPSY (N/A)  Patient location during evaluation: PACU Anesthesia Type: General Level of consciousness: awake and alert Pain management: pain level controlled Vital Signs Assessment: post-procedure vital signs reviewed and stable Respiratory status: spontaneous breathing, nonlabored ventilation, respiratory function stable and patient connected to nasal cannula oxygen Cardiovascular status: blood pressure returned to baseline and stable Postop Assessment: no signs of nausea or vomiting Anesthetic complications: no       Last Vitals:  Vitals:   05/07/16 1200 05/07/16 1215  BP: 139/75 (!) 149/93  Pulse: (!) 103 95  Resp: 14 (!) 21  Temp:      Last Pain:  Vitals:   05/07/16 1156  TempSrc:   PainSc: 0-No pain                 Naia Ruff S

## 2016-05-10 ENCOUNTER — Encounter (HOSPITAL_BASED_OUTPATIENT_CLINIC_OR_DEPARTMENT_OTHER): Payer: Self-pay | Admitting: Urology

## 2016-05-18 DIAGNOSIS — R972 Elevated prostate specific antigen [PSA]: Secondary | ICD-10-CM | POA: Diagnosis not present

## 2016-05-18 DIAGNOSIS — C61 Malignant neoplasm of prostate: Secondary | ICD-10-CM | POA: Diagnosis not present

## 2016-05-18 DIAGNOSIS — R350 Frequency of micturition: Secondary | ICD-10-CM | POA: Diagnosis not present

## 2016-05-21 ENCOUNTER — Other Ambulatory Visit (HOSPITAL_COMMUNITY): Payer: Self-pay | Admitting: Urology

## 2016-05-21 ENCOUNTER — Encounter: Payer: Self-pay | Admitting: Radiation Oncology

## 2016-05-21 DIAGNOSIS — C61 Malignant neoplasm of prostate: Secondary | ICD-10-CM

## 2016-05-25 ENCOUNTER — Encounter: Payer: Self-pay | Admitting: Radiation Oncology

## 2016-05-31 ENCOUNTER — Ambulatory Visit: Payer: Medicare Other | Admitting: Radiation Oncology

## 2016-05-31 ENCOUNTER — Ambulatory Visit: Payer: Medicare Other

## 2016-06-01 ENCOUNTER — Encounter (HOSPITAL_COMMUNITY)
Admission: RE | Admit: 2016-06-01 | Discharge: 2016-06-01 | Disposition: A | Discharge status: Home / Self Care | Payer: Medicare Other | Source: Ambulatory Visit | Attending: Urology | Admitting: Urology

## 2016-06-01 DIAGNOSIS — C61 Malignant neoplasm of prostate: Secondary | ICD-10-CM | POA: Diagnosis not present

## 2016-06-01 MED ORDER — TECHNETIUM TC 99M MEDRONATE IV KIT
21.4000 | PACK | Freq: Once | INTRAVENOUS | Status: AC | PRN
  Administered 2016-06-01: 21.4 via INTRAVENOUS

## 2016-06-02 ENCOUNTER — Ambulatory Visit
Admission: RE | Admit: 2016-06-02 | Discharge: 2016-06-02 | Disposition: A | Discharge status: Home / Self Care | Payer: Medicare Other | Source: Ambulatory Visit | Attending: Radiation Oncology | Admitting: Radiation Oncology

## 2016-06-02 ENCOUNTER — Encounter: Payer: Self-pay | Admitting: Radiation Oncology

## 2016-06-02 VITALS — BP 153/91 | HR 75 | Temp 98.6°F | Resp 16 | Wt 171.4 lb

## 2016-06-02 DIAGNOSIS — N400 Enlarged prostate without lower urinary tract symptoms: Secondary | ICD-10-CM | POA: Insufficient documentation

## 2016-06-02 DIAGNOSIS — R972 Elevated prostate specific antigen [PSA]: Secondary | ICD-10-CM | POA: Diagnosis not present

## 2016-06-02 DIAGNOSIS — C61 Malignant neoplasm of prostate: Secondary | ICD-10-CM

## 2016-06-02 DIAGNOSIS — K5730 Diverticulosis of large intestine without perforation or abscess without bleeding: Secondary | ICD-10-CM | POA: Diagnosis not present

## 2016-06-02 DIAGNOSIS — Z86010 Personal history of colonic polyps: Secondary | ICD-10-CM | POA: Diagnosis not present

## 2016-06-02 HISTORY — DX: Malignant neoplasm of prostate: C61

## 2016-06-02 NOTE — Progress Notes (Signed)
GU Location of Tumor / Histology: prostatic adenocarcinoma  If Prostate Cancer, Gleason Score is (4 + 3) and PSA is (35.92). Prostate volume: 53 cc.  Dontel Hendriks had a TURP for LUTS and BPH on 02/18/2007 by Dr. Tamala Julian at [facility] following this biopsy he got an infection thus, decided to transfer his care to Dr. Gaynelle Arabian. Patient has been closely monitor by Dr. Gaynelle Arabian since that time.  Biopsies of prostate  (if applicable) revealed:  FINAL DIAGNOSIS Diagnosis 1. Prostate, needle biopsy(ies), right base lateral BENIGN PROSTATIC TISSUE 2. Prostate, needle biopsy(ies), right base medial BENIGN PROSTATIC TISSUE 3. Prostate, needle biopsy(ies), right mid lateral BENIGN PROSTATIC TISSUE 4. Prostate, needle biopsy(ies), right mid medial BENIGN PROSTATIC TISSUE 5. Prostate, needle biopsy(ies), right apex lateral PROSTATIC ADENOCARCINOMA, GLEASON SCORE 3+4=7 INVOLVES 5% OF ONE CORE (PATTERN 4= 40%) 6. Prostate, needle biopsy(ies), right apex medial BENIGN PROSTATIC TISSUE 7. 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 Prostate, needle biopsy(ies), left base medial BENIGN PROSTATIC TISSUE 9. Prostate, needle biopsy(ies), left mid lateral PROSTATIC ADENOCARCINOMA, GLEASON SCORE 3+3=6 INVOLVES 10% OF ONE CORE 10. Prostate, needle biopsy(ies), left mid medial BENIGN PROSTATIC TISSUE 11. Prostate, needle biopsy(ies), left apex lateral BENIGN PROSTATIC TISSUE 12. Prostate, needle biopsy(ies), left apex medial PROSTATIC ADENOCARCINOMA, GLEASON SCORE 4+3=7 (GRADE GROUP 3) INVOLVES 50% OF ONE CORE (PATTERN 4= 70%)  Past/Anticipated interventions by urology, if any: TURP. Patient has had 4 negative biopsies. Fifth biopsy revealed disease. Referral to Dr. Tammi Klippel.   Past/Anticipated interventions by medical oncology, if any: no  Weight changes, if any: no  Bowel/Bladder complaints, if any: IPSS 14. Denies dysuria, hematuria or  leakage.   Nausea/Vomiting, if any: no  Pain issues, if any:  no  SAFETY ISSUES:  Prior radiation? no  Pacemaker/ICD? no  Possible current pregnancy? no  Is the patient on methotrexate? no  Current Complaints / other details:  71 year old male. 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 Patient reports he fell off the back of a truck while hunting in the fall (traumatic etiology ?) Patient here to explore all options surgery vs. Radiation.  06/01/16 Bone Scan: IMPRESSION: Foci of increased radiotracer uptake in the anterior left fifth and sixth ribs in a linear distribution. This distribution of radiotracer uptake is more suggestive of traumatic etiology than neoplastic etiology. Slight increased uptake on the right at L5 is of uncertain etiology. This area may well represent arthropathic change. It may be prudent to correlate with lumbar spine radiographs given isolated increased radiotracer uptake in this area. Increased uptake in the shoulders is likely of arthropathic etiology. Increased uptake in the face likely is due to paranasal sinus disease. No findings are seen on this study which are felt be particularly suspicious for bony metastatic disease.

## 2016-06-02 NOTE — Progress Notes (Signed)
Radiation Oncology         [phone]) [phone] ________________________________  Initial outpatient Consultation  Name: Jeremy Ruiz MRN: [id number]  Date: 06/02/2016  DOB: 05/12/1945  [id number], Bryson Ha, MD  Carolan Clines, MD   REFERRING PHYSICIAN: Carolan Clines, MD  DIAGNOSIS: 71 year-old gentleman with Stage T2a adenocarcinoma of the prostate with Gleason Score of 4+3, and PSA of 35.92    ICD-9-CM ICD-10-CM   1. Hx of adenomatous colonic polyps V12.72 Z86.010 Ambulatory referral to Social Work  2. Malignant neoplasm of prostate ([place]) 185 C61   3. Prostate cancer [facility]) 185 C61     HISTORY OF PRESENT ILLNESS: Jeremy Ruiz is a 71 y.o. male with a diagnosis of prostate cancer. He had a TURP for LUTS and BPH on 02/18/2007 by Dr. Tamala Julian at [facility]. Patient reports following this biopsy he got an infection thus, decided to transfer his care to Dr. Gaynelle Arabian.  Patient has been closely monitored by Dr. Gaynelle Arabian since this time. Patient has had 4 negative prostate biopsies previously.  The patient had an MRI prostate on 06/21/2014 which demonstrated a 5 mm area that was felt to be indeterminate though the appearance was considered worrisome for a small focus of macroscopic prostate cancer. Due to the fact that the patient had undergone multiple negative prostate biopsies prior to this, the decision was made to forego repeat prostate biopsy at that time.   He was noted to have a further elevated PSA of 35.92 in November 2017 prompting repeat MRI prostate on 03/29/2016 which showed a new left lobe nodule involving the anterior left peripheral zone, mid to apex. Findings were felt to be suspicious for relatively high volume, high grade disease without evidence of locally advanced or pelvic metastatic disease. The patient proceeded to targeted transrectal ultrasound with 12 biopsies of the prostate on 05/07/2016. The prostate volume measured 63 cc.  Out of 12 core biopsies, 3 were positive.  The  maximum Gleason score was 4+3, and this was seen in the left apex medial. Digital rectal examination on 05/18/16 revealed no palpable nodularity.  Patient underwent NM Bone Scan and CT abdomen and pelvis on 06/01/2016.  The Bone Scan demonstrated several areas of increased radiotracer uptake but no findings that were felt to be particularly suspicious for bony metastatic disease.  CT abdomen and pelvis demonstrated no evidence of metastatic prostate cancer. There were small retroperitoneal lymph nodes which were not felt to be pathologically enlarged.  The patient reviewed the biopsy results with his urologist and he has kindly been referred today for discussion of potential radiation treatment options.     PREVIOUS RADIATION THERAPY: No  PAST MEDICAL HISTORY:  Past Medical History:  Diagnosis Date  . Borderline hypertension   . BPH (benign prostatic hyperplasia)   . Diverticulosis of sigmoid colon   . Elevated PSA   . H/O Bell's palsy    04/ 2012  right side  . Hx of adenomatous colonic polyps    05/ 2017  tubular adenoma  . Nocturia   . Prostate cancer ([place])       PAST SURGICAL HISTORY: Past Surgical History:  Procedure Laterality Date  . COLONOSCOPY  last one 09-11-2015  . PROSTATE BIOPSY  10/14/2011   Procedure: BIOPSY TRANSRECTAL ULTRASONIC PROSTATE (TUBP);  Surgeon: Ailene Rud, MD;  Location[facility];  Service: Urology;  Laterality: N/A;      . PROSTATE BIOPSY N/A 05/07/2016   Procedure: SATURATED PROSTATE BIOPSY;  Surgeon: Carolan Clines, MD;  Location: [place];  Service: Urology;  Laterality: N/A;  . TRANSTHORACIC ECHOCARDIOGRAM  07-26-2010   NORMAL LVSF/ EF 06-65%   . TRANSURETHRAL RESECTION OF PROSTATE  2010    FAMILY HISTORY:  Family History  Problem Relation Age of Onset  . Cancer Sister     lung smoker  . Cancer Paternal Uncle     colon/questionable  . Cancer Sister     lung smoker  . Cancer Other     brain    . Colon cancer Neg Hx   . Esophageal cancer Neg Hx   . Pancreatic cancer Neg Hx   . Prostate cancer Neg Hx   . Rectal cancer Neg Hx   . Stomach cancer Neg Hx     SOCIAL HISTORY:  Social History   Social History  . Marital status: Married    Spouse name: N/A  . Number of children: N/A  . Years of education: N/A   Occupational History  . Not on file.   Social History Main Topics  . Smoking status: Never Smoker  . Smokeless tobacco: Never Used  . Alcohol use No  . Drug use: No  . Sexual activity: Yes   Other Topics Concern  . Not on file   Social History Narrative  . No narrative on file    ALLERGIES: Patient has no known allergies.  MEDICATIONS:  Current Outpatient Prescriptions  Medication Sig Dispense Refill  . hydrochlorothiazide (HYDRODIURIL) 25 MG tablet Take 25 mg by mouth every morning.     . Multiple Vitamins-Minerals (MULTIVITAMIN ADULTS PO) Take by mouth.    . Probiotic Product (PROBIOTIC DAILY PO) Take by mouth.    . sodium chloride (OCEAN) 0.65 % SOLN nasal spray Place 1 spray into both nostrils as needed for congestion.    . vitamin B-12 (CYANOCOBALAMIN) 1000 MCG tablet Take 1,000 mcg by mouth daily.     No current facility-administered medications for this encounter.     REVIEW OF SYSTEMS:  On review of systems, the patient reports that he is doing well overall. He denies any chest pain, shortness of breath, cough, fevers, chills, night sweats, unintended weight changes. He denies any bowel disturbances, and denies abdominal pain, nausea or vomiting. He denies any new musculoskeletal or joint aches or pains. His IPSS was 14, indicating moderate urinary symptoms including, incomplete emptying, frequency, intermittency, urgency, weak stream, straining, and nocturia x 2. He is able to complete sexual activity with most attempts. A complete review of systems is obtained and is otherwise negative.    PHYSICAL EXAM:  Wt Readings from Last 3 Encounters:   06/02/16 171 lb 6.4 oz (77.7 kg)  05/07/16 173 lb (78.5 kg)  09/11/15 168 lb (76.2 kg)   Temp Readings from Last 3 Encounters:  06/02/16 98.6 F (37 C) (Oral)  05/07/16 97.8 F (36.6 C)  09/11/15 98.2 F (36.8 C)   BP Readings from Last 3 Encounters:  06/02/16 (!) 153/91  05/07/16 (!) 156/79  09/11/15 133/78   Pulse Readings from Last 3 Encounters:  06/02/16 75  05/07/16 95  09/11/15 60   Pain Assessment Pain Score: 0-No pain/10  In general this is a well appearing caucasian male in no acute distress. He is alert and oriented x4 and appropriate throughout the examination. HEENT reveals that the patient is normocephalic, atraumatic. EOMs are intact. PERRLA. Skin is intact without any evidence of gross lesions. Cardiovascular exam reveals a regular rate and rhythm, no clicks rubs or murmurs are  auscultated. Chest is clear to auscultation bilaterally. Lymphatic assessment is performed and does not reveal any adenopathy in the cervical, supraclavicular, axillary, or inguinal chains. Abdomen has active bowel sounds in all quadrants and is intact. The abdomen is soft, non tender, non distended. Lower extremities are negative for pretibial pitting edema, deep calf tenderness, cyanosis or clubbing.   KPS = 100  100 - Normal; no complaints; no evidence of disease. 90   - Able to carry on normal activity; minor signs or symptoms of disease. 80   - Normal activity with effort; some signs or symptoms of disease. 71   - Cares for self; unable to carry on normal activity or to do active work. 60   - Requires occasional assistance, but is able to care for most of his personal needs. 50   - Requires considerable assistance and frequent medical care. 61   - Disabled; requires special care and assistance. 49   - Severely disabled; hospital admission is indicated although death not imminent. 78   - Very sick; hospital admission necessary; active supportive treatment necessary. 10   - Moribund;  fatal processes progressing rapidly. 0     - Dead  Karnofsky DA, [place], Craver LS and Burchenal [facility] [phone]) The use of the nitrogen mustards in the palliative treatment of carcinoma: with particular reference to bronchogenic carcinoma Cancer 1 634-56  LABORATORY DATA:  Lab Results  Component Value Date   WBC 6.0 07/25/2010   HGB 13.6 05/07/2016   HCT 40.0 05/07/2016   MCV 87.6 07/25/2010   PLT 227 07/25/2010   Lab Results  Component Value Date   NA 140 05/07/2016   K 3.9 05/07/2016   CL 97 (L) 05/07/2016   CO2 29 07/26/2010   Lab Results  Component Value Date   ALT 17 07/26/2010   AST 24 07/26/2010   ALKPHOS 53 07/26/2010   BILITOT 0.6 07/26/2010     RADIOGRAPHY: Nm Bone Scan Whole Body  Result Date: 06/01/2016 CLINICAL DATA:  Prostate carcinoma EXAM: NUCLEAR MEDICINE WHOLE BODY BONE SCAN TECHNIQUE: Whole body anterior and posterior images were obtained approximately 3 hours after intravenous injection of radiopharmaceutical. RADIOPHARMACEUTICALS:  21.4 mCi Technetium-4m MDP IV COMPARISON:  None. FINDINGS: There is increased radiotracer uptake in the anterior left fifth and sixth ribs in a linear distribution. There is mild increased radiotracer uptake in the rightward aspect of the L5 vertebral body. Increased uptake in the right and left shoulder regions is likely of arthropathic etiology. Mild mid facial increased uptake is likely due to sinus disease. Kidneys are noted in the flank positions bilaterally. IMPRESSION: Foci of increased radiotracer uptake in the anterior left fifth and sixth ribs in a linear distribution. This distribution of radiotracer uptake is more suggestive of traumatic etiology than neoplastic etiology. Slight increased uptake on the right at L5 is of uncertain etiology. This area may well represent arthropathic change. It may be prudent to correlate with lumbar spine radiographs given isolated increased radiotracer uptake in this area. Increased uptake in  the shoulders is likely of arthropathic etiology. Increased uptake in the face likely is due to paranasal sinus disease. No findings are seen on this study which are felt be particularly suspicious for bony metastatic disease. Electronically Signed   By: Lowella Grip III M.D.   On: 06/01/2016 20:35   [place] Transrectal Complete  Result Date: 05/14/2016 CLINICAL DATA:  Ultrasound was provided for use by the ordering physician, and a technical charge was applied by the  performing facility.  No radiologist interpretation/professional services rendered.   US Guided Needle Placement  Result Date: 05/14/2016 CLINICAL DATA:  Ultrasound was provided for use by the ordering physician, and a technical charge was applied by the performing facility.  No radiologist interpretation/professional services rendered.   [place] Intraoperative  Result Date: 05/14/2016 CLINICAL DATA:  Ultrasound was provided for use by the ordering physician, and a technical charge was applied by the performing facility.  No radiologist interpretation/professional services rendered.   [place] Prostate Biopsy Multiple  Result Date: 05/14/2016 CLINICAL DATA:  Ultrasound was provided for use by the ordering physician, and a technical charge was applied by the performing facility.  No radiologist interpretation/professional services rendered.      IMPRESSION/PLAN: 1. 71 y.o. gentleman with intermediate risk, Stage T2a adenocarcinoma of the prostate with Gleason Score of 4+3, and PSA of 35.92. Today we reviewed the findings and workup thus far.  We discussed the natural history of prostate cancer.  We reviewed the the implications of T-stage, Gleason's Score, and PSA on decision-making and outcomes in prostate cancer.  We discussed radiation treatment in the management of prostate cancer with regard to the logistics and delivery of external beam radiation treatment as well as the logistics and delivery of prostate brachytherapy.   We compared and  contrasted each of these approaches and also compared these against prostatectomy.  Due to the fact that the patient has previously undergone TURP in 2008, he is not a candidate for brachytherapy.The patient expressed interest in external beam radiotherapy.  The patient would like to proceed with prostate IMRT.  We will share our findings with Dr. Gaynelle Arabian and move forward with scheduling placement of three gold fiducial markers into the prostate to proceed with IMRT in the near future.      Nicholos Johns, PA-C    Tyler Pita, MD  [facility] Direct Dial: [phone]  Fax: [phone] [email or web address]  Skype  [email or web address]   This document serves as a record of services personally performed by Tyler Pita, MD and Freeman Caldron, PA-C. It was created on their behalf by Arlyce Harman, a trained medical scribe. The creation of this record is based on the scribe's personal observations and the provider's statements to them. This document has been checked and approved by the attending provider.

## 2016-06-02 NOTE — Progress Notes (Signed)
See progress note under physician encounter. 

## 2016-06-04 DIAGNOSIS — C61 Malignant neoplasm of prostate: Secondary | ICD-10-CM | POA: Insufficient documentation

## 2016-06-07 ENCOUNTER — Telehealth: Payer: Self-pay | Admitting: Medical Oncology

## 2016-06-07 NOTE — Progress Notes (Signed)
Spoke with [name] to introduce myself as the prostate nurse navigator and my role. I was not able to meet him the day he consulted with Dr. Tammi Klippel. He states that since his visit with Dr. Tammi Klippel he is scheduled for a consult with Dr. Alinda Money. If he is a candidate for surgery that it is the treatment of choice. I answered his questions regarding surgery and recovery. I stressed to him he will have educational classes regarding catheter care and PT for contienence. I assured him that he is in good hands with Dr. Alinda Money. I asked him to call me if I can be of assistance. It was a pleasures speaking with [name].

## 2016-06-11 ENCOUNTER — Telehealth: Payer: Self-pay | Admitting: Radiation Oncology

## 2016-06-11 NOTE — Telephone Encounter (Signed)
Received telephone message from patient that he has chosen to pursue his surgical option instead of radiation therapy.

## 2016-06-14 ENCOUNTER — Encounter: Payer: Self-pay | Admitting: Medical Oncology

## 2016-06-14 NOTE — Progress Notes (Signed)
Jeremy Ruiz called stating that he has decided to pursue robotic prostatectomy as treatment for his prostate cancer. Dr. Tammi Klippel was notified.

## 2016-06-15 DIAGNOSIS — C61 Malignant neoplasm of prostate: Secondary | ICD-10-CM | POA: Diagnosis not present

## 2016-06-16 ENCOUNTER — Other Ambulatory Visit: Payer: Self-pay | Admitting: Urology

## 2016-06-18 ENCOUNTER — Encounter: Payer: Self-pay | Admitting: *Deleted

## 2016-06-18 NOTE — Progress Notes (Signed)
[facility] Psychosocial Distress Screening Clinical Social Work  Clinical Social Work was referred by distress screening protocol.  The patient scored a 6 on the Psychosocial Distress Thermometer which indicates moderate distress. Clinical Social Worker reviewed chart and phoned pt to assess for distress and other psychosocial needs. Pt's phone rang many times and there was no way to leave a message on the phone. CSW saw on chart review pt does not have additional upcoming appts at [facility]. CSW team can follow up if additional needs arise. Please re-consult as needed.  ONCBCN DISTRESS SCREENING 06/02/2016  Screening Type Initial Screening  Distress experienced in past week (1-10) 6  Emotional problem type Nervousness/Anxiety;Adjusting to illness  Physician notified of physical symptoms Yes  Referral to clinical psychology No  Referral to clinical social work Yes  Referral to dietition No  Referral to financial advocate No  Referral to support programs No  Referral to palliative care No    Clinical Social Worker follow up needed: No.  If yes, follow up plan:   Loren Racer, Marlinda Mike, OSW-C Clinical Social Worker [place]  [facility] Phone: [phone] Fax: [phone]

## 2016-06-25 ENCOUNTER — Encounter (HOSPITAL_COMMUNITY): Payer: Self-pay

## 2016-06-25 NOTE — Patient Instructions (Addendum)
Jeremy Ruiz  06/25/2016   Your procedure is scheduled on: Monday 07/05/2016  Report to [facility]  Entrance take [place]  elevators to 3rd floor to  [place] at  0930  AM.  Call this number if you have problems the morning of surgery [phone]   Remember: ONLY 1 PERSON MAY GO WITH YOU TO SHORT STAY TO GET  READY MORNING OF [place].                     Follow Bowel Prep instructions from Dr. Lynne Ruiz office the day before surgery on Sunday 07/04/2016 along with a clear liquid diet all day til midnight!             Drink one bottle of Magnesium Citrate day before surgery on Sunday 07/04/2016 by 12 noon. Drink clear liquids all day from list!             Use one Fleet's enema the night before surgery!                 CLEAR LIQUID DIET   Foods Allowed                                                                     Foods Excluded  Coffee and tea, regular and decaf                             liquids that you cannot  Plain Jell-O in any flavor                                             see through such as: Fruit ices (not with fruit pulp)                                     milk, soups, orange juice  Iced Popsicles                                    All solid food Carbonated beverages, regular and diet                                    Cranberry, grape and apple juices Sports drinks like Gatorade Lightly seasoned clear broth or consume(fat free) Sugar, honey syrup  Sample Menu Breakfast                                Lunch                                     Supper Cranberry juice  Beef broth                            Chicken broth Jell-O                                     Grape juice                           Apple juice Coffee or tea                        Jell-O                                      Popsicle                                                Coffee or tea                        Coffee or  tea  _____________________________________________________________________     Do not eat food or drink liquids :After Midnight.     Take these medicines the morning of surgery with A SIP OF WATER: none                                 You may not have any metal on your body including hair pins and              piercings  Do not wear jewelry, make-up, lotions, powders or perfumes, deodorant             Do not wear nail polish.  Do not shave  48 hours prior to surgery.              Men may shave face and neck.   Do not bring valuables to the hospital. [place].  Contacts, dentures or bridgework may not be worn into surgery.  Leave suitcase in the car. After surgery it may be brought to your room.                Please read over the following fact sheets you were given: _____________________________________________________________________             [facility] - Preparing for Surgery Before surgery, you can play an important role.  Because skin is not sterile, your skin needs to be as free of germs as possible.  You can reduce the number of germs on your skin by washing with CHG (chlorahexidine gluconate) soap before surgery.  CHG is an antiseptic cleaner which kills germs and bonds with the skin to continue killing germs even after washing. Please DO NOT use if you have an allergy to CHG or antibacterial soaps.  If your skin becomes reddened/irritated stop using the CHG and inform your nurse when you arrive at Short Stay. Do not shave (including legs and underarms) for at least 48 hours  prior to the first CHG shower.  You may shave your face/neck. Please follow these instructions carefully:  1.  Shower with CHG Soap the night before surgery and the  morning of Surgery.  2.  If you choose to wash your hair, wash your hair first as usual with your  normal  shampoo.  3.  After you shampoo, rinse your hair and body thoroughly to  remove the  shampoo.                           4.  Use CHG as you would any other liquid soap.  You can apply chg directly  to the skin and wash                       Gently with a scrungie or clean washcloth.  5.  Apply the CHG Soap to your body ONLY FROM THE NECK DOWN.   Do not use on face/ open                           Wound or open sores. Avoid contact with eyes, ears mouth and genitals (private parts).                       Wash face,  Genitals (private parts) with your normal soap.             6.  Wash thoroughly, paying special attention to the area where your surgery  will be performed.  7.  Thoroughly rinse your body with warm water from the neck down.  8.  DO NOT shower/wash with your normal soap after using and rinsing off  the CHG Soap.                9.  Pat yourself dry with a clean towel.            10.  Wear clean pajamas.            11.  Place clean sheets on your bed the night of your first shower and do not  sleep with pets. Day of Surgery : Do not apply any lotions/deodorants the morning of surgery.  Please wear clean clothes to the hospital/surgery center.  FAILURE TO FOLLOW THESE INSTRUCTIONS MAY RESULT IN THE CANCELLATION OF YOUR SURGERY PATIENT SIGNATURE_________________________________  NURSE SIGNATURE__________________________________  ________________________________________________________________________   Jeremy Ruiz  An incentive spirometer is a tool that can help keep your lungs clear and active. This tool measures how well you are filling your lungs with each breath. Taking long deep breaths may help reverse or decrease the chance of developing breathing (pulmonary) problems (especially infection) following:  A long period of time when you are unable to move or be active. BEFORE THE PROCEDURE   If the spirometer includes an indicator to show your best effort, your nurse or respiratory therapist will set it to a desired goal.  If possible, sit  up straight or lean slightly forward. Try not to slouch.  Hold the incentive spirometer in an upright position. INSTRUCTIONS FOR USE  1. Sit on the edge of your bed if possible, or sit up as far as you can in bed or on a chair. 2. Hold the incentive spirometer in an upright position. 3. Breathe out normally. 4. Place the mouthpiece in your mouth and seal your lips tightly around it. 5. Breathe  in slowly and as deeply as possible, raising the piston or the ball toward the top of the column. 6. Hold your breath for 3-5 seconds or for as long as possible. Allow the piston or ball to fall to the bottom of the column. 7. Remove the mouthpiece from your mouth and breathe out normally. 8. Rest for a few seconds and repeat Steps 1 through 7 at least 10 times every 1-2 hours when you are awake. Take your time and take a few normal breaths between deep breaths. 9. The spirometer may include an indicator to show your best effort. Use the indicator as a goal to work toward during each repetition. 10. After each set of 10 deep breaths, practice coughing to be sure your lungs are clear. If you have an incision (the cut made at the time of surgery), support your incision when coughing by placing a pillow or rolled up towels firmly against it. Once you are able to get out of bed, walk around indoors and cough well. You may stop using the incentive spirometer when instructed by your caregiver.  RISKS AND COMPLICATIONS  Take your time so you do not get dizzy or light-headed.  If you are in pain, you may need to take or ask for pain medication before doing incentive spirometry. It is harder to take a deep breath if you are having pain. AFTER USE  Rest and breathe slowly and easily.  It can be helpful to keep track of a log of your progress. Your caregiver can provide you with a simple table to help with this. If you are using the spirometer at home, follow these instructions: [place] IF:   You are  having difficultly using the spirometer.  You have trouble using the spirometer as often as instructed.  Your pain medication is not giving enough relief while using the spirometer.  You develop fever of 100.5 F (38.1 C) or higher. SEEK IMMEDIATE MEDICAL CARE IF:   You cough up bloody sputum that had not been present before.  You develop fever of 102 F (38.9 C) or greater.  You develop worsening pain at or near the incision site. MAKE SURE YOU:   Understand these instructions.  Will watch your condition.  Will get help right away if you are not doing well or get worse. Document Released: 08/16/2006 Document Revised: 06/28/2011 Document Reviewed: 10/17/2006 ExitCare Patient Information 2014 Exit[employer], [place].   ________________________________________________________________________  WHAT IS A BLOOD TRANSFUSION? Blood Transfusion Information  A transfusion is the replacement of blood or some of its parts. Blood is made up of multiple cells which provide different functions.  Red blood cells carry oxygen and are used for blood loss replacement.  White blood cells fight against infection.  Platelets control bleeding.  Plasma helps clot blood.  Other blood products are available for specialized needs, such as hemophilia or other clotting disorders. BEFORE THE TRANSFUSION  Who gives blood for transfusions?   Healthy volunteers who are fully evaluated to make sure their blood is safe. This is blood bank blood. Transfusion therapy is the safest it has ever been in the practice of medicine. Before blood is taken from a donor, a complete history is taken to make sure that person has no history of diseases nor engages in risky social behavior (examples are intravenous drug use or sexual activity with multiple partners). The donor's travel history is screened to minimize risk of transmitting infections, such as malaria. The donated blood is tested for  signs of infectious diseases,  such as HIV and hepatitis. The blood is then tested to be sure it is compatible with you in order to minimize the chance of a transfusion reaction. If you or a relative donates blood, this is often done in anticipation of surgery and is not appropriate for emergency situations. It takes many days to process the donated blood. RISKS AND COMPLICATIONS Although transfusion therapy is very safe and saves many lives, the main dangers of transfusion include:   Getting an infectious disease.  Developing a transfusion reaction. This is an allergic reaction to something in the blood you were given. Every precaution is taken to prevent this. The decision to have a blood transfusion has been considered carefully by your caregiver before blood is given. Blood is not given unless the benefits outweigh the risks. AFTER THE TRANSFUSION  Right after receiving a blood transfusion, you will usually feel much better and more energetic. This is especially true if your red blood cells have gotten low (anemic). The transfusion raises the level of the red blood cells which carry oxygen, and this usually causes an energy increase.  The nurse administering the transfusion will monitor you carefully for complications. HOME CARE INSTRUCTIONS  No special instructions are needed after a transfusion. You may find your energy is better. Speak with your caregiver about any limitations on activity for underlying diseases you may have. SEEK MEDICAL CARE IF:   Your condition is not improving after your transfusion.  You develop redness or irritation at the intravenous (IV) site. SEEK IMMEDIATE MEDICAL CARE IF:  Any of the following symptoms occur over the next 12 hours:  Shaking chills.  You have a temperature by mouth above 102 F (38.9 C), not controlled by medicine.  Chest, back, or muscle pain.  People around you feel you are not acting correctly or are confused.  Shortness of breath or difficulty  breathing.  Dizziness and fainting.  You get a rash or develop hives.  You have a decrease in urine output.  Your urine turns a dark color or changes to pink, red, or brown. Any of the following symptoms occur over the next 10 days:  You have a temperature by mouth above 102 F (38.9 C), not controlled by medicine.  Shortness of breath.  Weakness after normal activity.  The white part of the eye turns yellow (jaundice).  You have a decrease in the amount of urine or are urinating less often.  Your urine turns a dark color or changes to pink, red, or brown. Document Released: 04/02/2000 Document Revised: 06/28/2011 Document Reviewed: 11/20/2007 [facility] Patient Information 2014 [place], [place].  _______________________________________________________________________

## 2016-06-28 NOTE — Progress Notes (Signed)
06/01/2016-noted bone scan in EPIC

## 2016-06-29 ENCOUNTER — Encounter (HOSPITAL_COMMUNITY)
Admission: RE | Admit: 2016-06-29 | Discharge: 2016-06-29 | Disposition: A | Discharge status: Home / Self Care | Payer: Medicare Other | Source: Ambulatory Visit | Attending: Urology | Admitting: Urology

## 2016-06-29 ENCOUNTER — Encounter (HOSPITAL_COMMUNITY): Payer: Self-pay

## 2016-06-29 ENCOUNTER — Ambulatory Visit (HOSPITAL_COMMUNITY)
Admission: RE | Admit: 2016-06-29 | Discharge: 2016-06-29 | Disposition: A | Discharge status: Home / Self Care | Payer: Medicare Other | Source: Ambulatory Visit | Attending: Urology | Admitting: Urology

## 2016-06-29 DIAGNOSIS — Z01818 Encounter for other preprocedural examination: Secondary | ICD-10-CM | POA: Diagnosis not present

## 2016-06-29 DIAGNOSIS — I10 Essential (primary) hypertension: Secondary | ICD-10-CM | POA: Insufficient documentation

## 2016-06-29 DIAGNOSIS — Z0183 Encounter for blood typing: Secondary | ICD-10-CM | POA: Insufficient documentation

## 2016-06-29 DIAGNOSIS — Z8546 Personal history of malignant neoplasm of prostate: Secondary | ICD-10-CM | POA: Diagnosis not present

## 2016-06-29 DIAGNOSIS — Z01811 Encounter for preprocedural respiratory examination: Secondary | ICD-10-CM

## 2016-06-29 DIAGNOSIS — C61 Malignant neoplasm of prostate: Secondary | ICD-10-CM | POA: Diagnosis not present

## 2016-06-29 DIAGNOSIS — Z01812 Encounter for preprocedural laboratory examination: Secondary | ICD-10-CM | POA: Insufficient documentation

## 2016-06-29 HISTORY — DX: Essential (primary) hypertension: I10

## 2016-06-29 LAB — CBC
HCT: 39 % (ref 39.0–52.0)
HEMOGLOBIN: 12.6 g/dL — AB (ref 13.0–17.0)
MCH: 27.9 pg (ref 26.0–34.0)
MCHC: 32.3 g/dL (ref 30.0–36.0)
MCV: 86.5 fL (ref 78.0–100.0)
Platelets: 291 10*3/uL (ref 150–400)
RBC: 4.51 MIL/uL (ref 4.22–5.81)
RDW: 14.1 % (ref 11.5–15.5)
WBC: 6.5 10*3/uL (ref 4.0–10.5)

## 2016-06-29 LAB — BASIC METABOLIC PANEL
ANION GAP: 7 (ref 5–15)
BUN: 25 mg/dL — ABNORMAL HIGH (ref 6–20)
CALCIUM: 9.2 mg/dL (ref 8.9–10.3)
CO2: 28 mmol/L (ref 22–32)
Chloride: 103 mmol/L (ref 101–111)
Creatinine, Ser: 1.1 mg/dL (ref 0.61–1.24)
GFR calc non Af Amer: >60 mL/min (ref >60)
Glucose, Bld: 93 mg/dL (ref 65–99)
Potassium: 4.1 mmol/L (ref 3.5–5.1)
Sodium: 138 mmol/L (ref 135–145)

## 2016-06-29 LAB — ABO/RH: ABO/RH(D): A NEG

## 2016-06-30 DIAGNOSIS — C61 Malignant neoplasm of prostate: Secondary | ICD-10-CM | POA: Diagnosis not present

## 2016-06-30 DIAGNOSIS — M6281 Muscle weakness (generalized): Secondary | ICD-10-CM | POA: Diagnosis not present

## 2016-07-01 DIAGNOSIS — C61 Malignant neoplasm of prostate: Secondary | ICD-10-CM | POA: Diagnosis not present

## 2016-07-01 DIAGNOSIS — M6281 Muscle weakness (generalized): Secondary | ICD-10-CM | POA: Diagnosis not present

## 2016-07-02 NOTE — H&P (Signed)
CC/HPI: CC: Prostate Cancer     [name] is a 71 year old gentleman with a history of a rising PSA s/p 4 prior negative prostate biopsies. His PSA further increased to 35.9 prompting an MRI of the prostate by Dr. Gaynelle Arabian on 03/29/16 that indicated a 2.5 cm PI-RADS 4 lesion at the left anterior mid/apex of the prostate. 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 He has seen Dr. Tammi Klippel recently for a radiation oncology consultation.   Family history: None.   Imaging studies:  03/29/16: MRI of the prostate - No EPE, SVI, or LAD  06/01/16: CT abdomen and pelvis - negative for metastatic disease  06/01/16: Bone scan - Uptake at the left 5th and 6th ribs consistent with probable trauma   PMH: He has a history of hypertension.  PSH: TURP   TNM stage: cT1c N0 M0  PSA: 35.9  Gleason score: 4+3=7  Biopsy (05/07/16): 3/37 cores positive  Left: L apex (50%, 4+3=7), L lateral mid (10%, 3+3=6)  Right: R lateral apex (5%, 3+4=7)  Prostate volume: 63 cc   Nomogram  OC disease: 18%  EPE: 76%  SVI: 5%  LNI: 8%  PFS (5 year, 10 year): 41%,27%   Urinary function: IPSS is 12.  Erectile function: SHIM score is 18. He does not take any medication.     ALLERGIES: No Allergies    MEDICATIONS: Hydrochlorothiazide 12.5 mg tablet Oral  Multi-Day Vitamins TABS 1 Oral Daily     GU PSH: Cystoscopy TURP - 2012 Locm 300-399Mg /Ml Iodine,1Ml - 06/01/2016 Prostate Needle Biopsy - 2013, 2013 Prostate Saturation Sampling - 05/07/2016    NON-GU PSH: None   GU PMH: Prostate Cancer - 05/18/2016 BPH w/LUTS - 03/09/2016 Elevated PSA - 03/09/2016, Elevated prostate specific antigen (PSA), - 01/22/2015 Urinary Frequency, Increased urinary frequency - 01/22/2015      PMH Notes:  2010-08-26 13:07:20 - Note: Arthritis   NON-GU PMH: Encounter for general adult medical examination without abnormal findings,  Encounter for preventive health examination - 2015    FAMILY HISTORY: Father Deceased At Age53 ___ - Runs In Family Mother Deceased At Age 60 from diabetic complicati - Runs In Family Stroke Syndrome - Mother   SOCIAL HISTORY: Marital Status: Married Current Smoking Status: Patient has never smoked.  Has never drank.  Drinks 4+ caffeinated drinks per day. Patient's occupation Engineer, maintenance.     Notes: 1 son   REVIEW OF SYSTEMS:    GU Review Male:   Patient reports get up at night to urinate. Patient denies frequent urination, hard to postpone urination, burning/ pain with urination, leakage of urine, stream starts and stops, trouble starting your streams, and have to strain to urinate .  Gastrointestinal (Upper):   Patient denies nausea and vomiting.  Gastrointestinal (Lower):   Patient denies diarrhea and constipation.  Constitutional:   Patient denies fever, night sweats, weight loss, and fatigue.  Skin:   Patient denies skin rash/ lesion and itching.  Eyes:   Patient denies blurred vision and double vision.  Ears/ Nose/ Throat:   Patient reports sinus problems. Patient denies sore throat.  Hematologic/Lymphatic:   Patient denies swollen glands and easy bruising.  Cardiovascular:   Patient denies leg swelling and chest pains.  Respiratory:   Patient denies cough and shortness of breath.  Endocrine:   Patient denies excessive thirst.  Musculoskeletal:   Patient denies back pain and joint  pain.  Neurological:   Patient denies headaches and dizziness.  Psychologic:   Patient denies depression and anxiety.    Weight 171 lb / 77.56 kg  Pulse 78 /min  BMI 26.0 kg/m    MULTI-SYSTEM PHYSICAL EXAMINATION:    Constitutional: Well-nourished. No physical deformities. Normally developed. Good grooming.  Neck: Neck symmetrical, not swollen. Normal tracheal position.  Respiratory: No labored breathing, no use of accessory muscles. Clear bilaterally.  Cardiovascular: Normal temperature,  normal extremity pulses, no swelling, no varicosities. RRR.  Lymphatic: No enlargement of neck, axillae, groin.  Skin: No paleness, no jaundice, no cyanosis. No lesion, no ulcer, no rash.  Neurologic / Psychiatric: Oriented to time, oriented to place, oriented to person. No depression, no anxiety, no agitation.  Gastrointestinal: No mass, no tenderness, no rigidity, non obese abdomen.  Eyes: Normal conjunctivae. Normal eyelids.  Ears, Nose, Mouth, and Throat: Left ear no scars, no lesions, no masses. Right ear no scars, no lesions, no masses. Nose no scars, no lesions, no masses. Normal hearing. Normal lips.  Musculoskeletal: Normal gait and station of head and neck.        ASSESSMENT:      ICD-10 Details  1 GU:   Prostate Cancer - C61    PLAN:              1. Prostate cancer:   He has confirmed his decision to proceed with surgical therapy. He will be scheduled for a bilateral nerve sparing robotic-assisted laparoscopic radical prostatectomy and pelvic lymphadenectomy in the near future.

## 2016-07-05 ENCOUNTER — Ambulatory Visit (HOSPITAL_COMMUNITY): Payer: Medicare Other | Admitting: Certified Registered Nurse Anesthetist

## 2016-07-05 ENCOUNTER — Encounter (HOSPITAL_COMMUNITY)
Admission: RE | Disposition: A | Discharge status: Home / Self Care | Payer: Self-pay | Source: Ambulatory Visit | Attending: Urology

## 2016-07-05 ENCOUNTER — Encounter (HOSPITAL_COMMUNITY): Payer: Self-pay | Admitting: *Deleted

## 2016-07-05 ENCOUNTER — Observation Stay (HOSPITAL_COMMUNITY)
Admission: RE | Admit: 2016-07-05 | Discharge: 2016-07-06 | Disposition: A | Discharge status: Home / Self Care | Payer: Medicare Other | Source: Ambulatory Visit | Attending: Urology | Admitting: Urology

## 2016-07-05 DIAGNOSIS — I10 Essential (primary) hypertension: Secondary | ICD-10-CM | POA: Insufficient documentation

## 2016-07-05 DIAGNOSIS — C61 Malignant neoplasm of prostate: Secondary | ICD-10-CM | POA: Diagnosis not present

## 2016-07-05 DIAGNOSIS — Z86010 Personal history of colonic polyps: Secondary | ICD-10-CM | POA: Diagnosis not present

## 2016-07-05 HISTORY — PX: ROBOT ASSISTED LAPAROSCOPIC RADICAL PROSTATECTOMY: SHX5141

## 2016-07-05 HISTORY — PX: LYMPHADENECTOMY: SHX5960

## 2016-07-05 LAB — TYPE AND SCREEN
ABO/RH(D): A NEG
ANTIBODY SCREEN: NEGATIVE

## 2016-07-05 LAB — HEMOGLOBIN AND HEMATOCRIT, BLOOD
HEMATOCRIT: 34.7 % — AB (ref 39.0–52.0)
HEMOGLOBIN: 11.9 g/dL — AB (ref 13.0–17.0)

## 2016-07-05 SURGERY — XI ROBOTIC ASSISTED LAPAROSCOPIC RADICAL PROSTATECTOMY LEVEL 2
Anesthesia: General

## 2016-07-05 MED ORDER — HEPARIN SODIUM (PORCINE) 1000 UNIT/ML IJ SOLN
INTRAMUSCULAR | Status: AC
  Filled 2016-07-05: qty 1

## 2016-07-05 MED ORDER — PROMETHAZINE HCL 25 MG/ML IJ SOLN: 6.2500 mg | INTRAMUSCULAR | Status: DC | PRN

## 2016-07-05 MED ORDER — BUPIVACAINE-EPINEPHRINE 0.25% -1:200000 IJ SOLN
INTRAMUSCULAR | Status: AC
  Filled 2016-07-05: qty 1

## 2016-07-05 MED ORDER — OXYBUTYNIN CHLORIDE 5 MG PO TABS: 5.0000 mg | ORAL_TABLET | Freq: Three times a day (TID) | ORAL | Status: DC | PRN

## 2016-07-05 MED ORDER — LACTATED RINGERS IV SOLN
INTRAVENOUS | Status: DC | PRN
  Administered 2016-07-05: 12:00:00

## 2016-07-05 MED ORDER — HYDROMORPHONE HCL 1 MG/ML IJ SOLN
INTRAMUSCULAR | Status: AC
  Administered 2016-07-05: 0.5 mg via INTRAVENOUS
  Filled 2016-07-05: qty 1

## 2016-07-05 MED ORDER — FENTANYL CITRATE (PF) 100 MCG/2ML IJ SOLN
INTRAMUSCULAR | Status: DC | PRN
  Administered 2016-07-05: 50 ug via INTRAVENOUS
  Administered 2016-07-05: 100 ug via INTRAVENOUS
  Administered 2016-07-05 (×2): 50 ug via INTRAVENOUS

## 2016-07-05 MED ORDER — ROCURONIUM BROMIDE 50 MG/5ML IV SOSY
PREFILLED_SYRINGE | INTRAVENOUS | Status: AC
  Filled 2016-07-05: qty 5

## 2016-07-05 MED ORDER — MENTHOL 3 MG MT LOZG
1.0000 | LOZENGE | OROMUCOSAL | Status: DC | PRN
  Filled 2016-07-05 (×2): qty 9

## 2016-07-05 MED ORDER — SODIUM CHLORIDE 0.9 % IV BOLUS (SEPSIS)
1000.0000 mL | Freq: Once | INTRAVENOUS | Status: AC
  Administered 2016-07-05: 1000 mL via INTRAVENOUS

## 2016-07-05 MED ORDER — FLEET ENEMA 7-19 GM/118ML RE ENEM
1.0000 | ENEMA | Freq: Once | RECTAL | Status: AC
  Administered 2016-07-05: 1 via RECTAL

## 2016-07-05 MED ORDER — STERILE WATER FOR IRRIGATION IR SOLN
Status: DC | PRN
  Administered 2016-07-05: 1000 mL

## 2016-07-05 MED ORDER — LIDOCAINE 2% (20 MG/ML) 5 ML SYRINGE
INTRAMUSCULAR | Status: DC | PRN
  Administered 2016-07-05: 80 mg via INTRAVENOUS

## 2016-07-05 MED ORDER — HYDROCODONE-ACETAMINOPHEN 5-325 MG PO TABS: 1.0000 | ORAL_TABLET | Freq: Four times a day (QID) | ORAL | 0 refills | Status: DC | PRN

## 2016-07-05 MED ORDER — PHENYLEPHRINE HCL 10 MG/ML IJ SOLN
INTRAMUSCULAR | Status: AC
  Filled 2016-07-05: qty 1

## 2016-07-05 MED ORDER — OXYCODONE HCL 5 MG PO TABS
5.0000 mg | ORAL_TABLET | Freq: Once | ORAL | Status: DC | PRN
Start: 2016-07-05 — End: 2016-07-05

## 2016-07-05 MED ORDER — LIDOCAINE 2% (20 MG/ML) 5 ML SYRINGE
INTRAMUSCULAR | Status: AC
  Filled 2016-07-05: qty 5

## 2016-07-05 MED ORDER — PROPOFOL 10 MG/ML IV BOLUS
INTRAVENOUS | Status: AC
  Filled 2016-07-05: qty 20

## 2016-07-05 MED ORDER — DEXAMETHASONE SODIUM PHOSPHATE 10 MG/ML IJ SOLN
INTRAMUSCULAR | Status: AC
  Filled 2016-07-05: qty 1

## 2016-07-05 MED ORDER — DEXAMETHASONE SODIUM PHOSPHATE 10 MG/ML IJ SOLN
INTRAMUSCULAR | Status: DC | PRN
  Administered 2016-07-05: 10 mg via INTRAVENOUS

## 2016-07-05 MED ORDER — SUGAMMADEX SODIUM 200 MG/2ML IV SOLN
INTRAVENOUS | Status: AC
  Filled 2016-07-05: qty 2

## 2016-07-05 MED ORDER — HYDROMORPHONE HCL 1 MG/ML IJ SOLN
0.5000 mg | INTRAMUSCULAR | Status: DC | PRN
  Administered 2016-07-06: 1 mg via INTRAVENOUS
  Filled 2016-07-05: qty 1

## 2016-07-05 MED ORDER — POLYETHYL GLYC-PROPYL GLYC PF 0.4-0.3 % OP SOLN: 1.0000 [drp] | Freq: Three times a day (TID) | OPHTHALMIC | Status: DC | PRN

## 2016-07-05 MED ORDER — SULFAMETHOXAZOLE-TRIMETHOPRIM 800-160 MG PO TABS: 1.0000 | ORAL_TABLET | Freq: Two times a day (BID) | ORAL | 0 refills | Status: DC

## 2016-07-05 MED ORDER — CEFAZOLIN SODIUM-DEXTROSE 2-4 GM/100ML-% IV SOLN
2.0000 g | INTRAVENOUS | Status: AC
  Administered 2016-07-05: 2 g via INTRAVENOUS
  Filled 2016-07-05: qty 100

## 2016-07-05 MED ORDER — ONDANSETRON HCL 4 MG/2ML IJ SOLN: 4.0000 mg | INTRAMUSCULAR | Status: DC | PRN

## 2016-07-05 MED ORDER — HYDROMORPHONE HCL 1 MG/ML IJ SOLN
0.2500 mg | INTRAMUSCULAR | Status: DC | PRN
  Administered 2016-07-05 (×4): 0.5 mg via INTRAVENOUS

## 2016-07-05 MED ORDER — KCL IN DEXTROSE-NACL 20-5-0.45 MEQ/L-%-% IV SOLN
INTRAVENOUS | Status: DC
  Administered 2016-07-05 – 2016-07-06 (×2): via INTRAVENOUS
  Filled 2016-07-05 (×3): qty 1000

## 2016-07-05 MED ORDER — MAGNESIUM CITRATE PO SOLN: 1.0000 | Freq: Once | ORAL | Status: DC

## 2016-07-05 MED ORDER — DEXTROSE 5 % IV SOLN
INTRAVENOUS | Status: DC | PRN
  Administered 2016-07-05: 40 ug/min via INTRAVENOUS

## 2016-07-05 MED ORDER — BUPIVACAINE-EPINEPHRINE 0.25% -1:200000 IJ SOLN
INTRAMUSCULAR | Status: DC | PRN
  Administered 2016-07-05: 20 mL

## 2016-07-05 MED ORDER — ROCURONIUM BROMIDE 50 MG/5ML IV SOSY
PREFILLED_SYRINGE | INTRAVENOUS | Status: DC | PRN
  Administered 2016-07-05: 10 mg via INTRAVENOUS
  Administered 2016-07-05: 50 mg via INTRAVENOUS
  Administered 2016-07-05 (×2): 10 mg via INTRAVENOUS

## 2016-07-05 MED ORDER — ONDANSETRON HCL 4 MG/2ML IJ SOLN
INTRAMUSCULAR | Status: AC
  Filled 2016-07-05: qty 2

## 2016-07-05 MED ORDER — DIPHENHYDRAMINE HCL 50 MG/ML IJ SOLN: 12.5000 mg | Freq: Four times a day (QID) | INTRAMUSCULAR | Status: DC | PRN

## 2016-07-05 MED ORDER — CEFAZOLIN SODIUM-DEXTROSE 2-4 GM/100ML-% IV SOLN
2.0000 g | Freq: Three times a day (TID) | INTRAVENOUS | Status: AC
  Administered 2016-07-05 – 2016-07-06 (×2): 2 g via INTRAVENOUS
  Filled 2016-07-05 (×2): qty 100

## 2016-07-05 MED ORDER — OXYCODONE HCL 5 MG/5ML PO SOLN
5.0000 mg | Freq: Once | ORAL | Status: DC | PRN
  Filled 2016-07-05: qty 5

## 2016-07-05 MED ORDER — LACTATED RINGERS IV SOLN
INTRAVENOUS | Status: DC
  Administered 2016-07-05 (×3): via INTRAVENOUS

## 2016-07-05 MED ORDER — SENNA 8.6 MG PO TABS
1.0000 | ORAL_TABLET | Freq: Two times a day (BID) | ORAL | Status: DC
  Administered 2016-07-05 – 2016-07-06 (×2): 8.6 mg via ORAL
  Filled 2016-07-05 (×2): qty 1

## 2016-07-05 MED ORDER — INFLUENZA VAC SPLIT QUAD 0.5 ML IM SUSY: 0.5000 mL | PREFILLED_SYRINGE | INTRAMUSCULAR | Status: DC

## 2016-07-05 MED ORDER — SUGAMMADEX SODIUM 200 MG/2ML IV SOLN
INTRAVENOUS | Status: DC | PRN
  Administered 2016-07-05: 200 mg via INTRAVENOUS

## 2016-07-05 MED ORDER — KETOROLAC TROMETHAMINE 15 MG/ML IJ SOLN
15.0000 mg | Freq: Four times a day (QID) | INTRAMUSCULAR | Status: DC
  Administered 2016-07-05 – 2016-07-06 (×4): 15 mg via INTRAVENOUS
  Filled 2016-07-05 (×4): qty 1

## 2016-07-05 MED ORDER — PROPOFOL 10 MG/ML IV BOLUS
INTRAVENOUS | Status: DC | PRN
  Administered 2016-07-05: 200 mg via INTRAVENOUS

## 2016-07-05 MED ORDER — DOCUSATE SODIUM 100 MG PO CAPS
100.0000 mg | ORAL_CAPSULE | Freq: Two times a day (BID) | ORAL | Status: DC
  Administered 2016-07-05 – 2016-07-06 (×2): 100 mg via ORAL
  Filled 2016-07-05 (×2): qty 1

## 2016-07-05 MED ORDER — HYDROCHLOROTHIAZIDE 25 MG PO TABS
25.0000 mg | ORAL_TABLET | Freq: Every day | ORAL | Status: DC
  Administered 2016-07-06: 25 mg via ORAL
  Filled 2016-07-05: qty 1

## 2016-07-05 MED ORDER — DIPHENHYDRAMINE HCL 12.5 MG/5ML PO ELIX: 12.5000 mg | ORAL_SOLUTION | Freq: Four times a day (QID) | ORAL | Status: DC | PRN

## 2016-07-05 MED ORDER — ONDANSETRON HCL 4 MG/2ML IJ SOLN
INTRAMUSCULAR | Status: DC | PRN
  Administered 2016-07-05: 4 mg via INTRAVENOUS

## 2016-07-05 MED ORDER — POLYVINYL ALCOHOL 1.4 % OP SOLN
1.0000 [drp] | Freq: Three times a day (TID) | OPHTHALMIC | Status: DC | PRN
  Filled 2016-07-05: qty 15

## 2016-07-05 MED ORDER — OXYCODONE HCL 5 MG PO TABS: 5.0000 mg | ORAL_TABLET | ORAL | Status: DC | PRN

## 2016-07-05 MED ORDER — SODIUM CHLORIDE 0.9 % IR SOLN
Status: DC | PRN
  Administered 2016-07-05: 1000 mL

## 2016-07-05 MED ORDER — ACETAMINOPHEN 325 MG PO TABS
650.0000 mg | ORAL_TABLET | Freq: Four times a day (QID) | ORAL | Status: DC
  Administered 2016-07-05 – 2016-07-06 (×3): 650 mg via ORAL
  Filled 2016-07-05 (×3): qty 2

## 2016-07-05 MED ORDER — SALINE SPRAY 0.65 % NA SOLN: 1.0000 | NASAL | Status: DC | PRN

## 2016-07-05 MED ORDER — FENTANYL CITRATE (PF) 250 MCG/5ML IJ SOLN
INTRAMUSCULAR | Status: AC
  Filled 2016-07-05: qty 5

## 2016-07-05 SURGICAL SUPPLY — 53 items
ADH SKN CLS APL DERMABOND .7 (GAUZE/BANDAGES/DRESSINGS) ×2
APPLICATOR COTTON TIP 6IN STRL (MISCELLANEOUS) ×3 IMPLANT
CATH FOLEY 2WAY SLVR 18FR 30CC (CATHETERS) ×3 IMPLANT
CATH ROBINSON RED A/P 16FR (CATHETERS) ×3 IMPLANT
CATH ROBINSON RED A/P 8FR (CATHETERS) ×3 IMPLANT
CATH TIEMANN FOLEY 18FR 5CC (CATHETERS) ×3 IMPLANT
CHLORAPREP W/TINT 26ML (MISCELLANEOUS) ×3 IMPLANT
CLIP LIGATING HEM O LOK PURPLE (MISCELLANEOUS) ×7 IMPLANT
COVER SURGICAL LIGHT HANDLE (MISCELLANEOUS) ×3 IMPLANT
COVER TIP SHEARS 8 DVNC (MISCELLANEOUS) ×2 IMPLANT
COVER TIP SHEARS 8MM DA VINCI (MISCELLANEOUS) ×1
CUTTER ECHEON FLEX ENDO 45 340 (ENDOMECHANICALS) ×3 IMPLANT
DECANTER SPIKE VIAL GLASS SM (MISCELLANEOUS) ×3 IMPLANT
DERMABOND ADVANCED (GAUZE/BANDAGES/DRESSINGS) ×1
DERMABOND ADVANCED .7 DNX12 (GAUZE/BANDAGES/DRESSINGS) ×1 IMPLANT
DRAPE ARM DVNC X/XI (DISPOSABLE) ×8 IMPLANT
DRAPE COLUMN DVNC XI (DISPOSABLE) ×2 IMPLANT
DRAPE DA VINCI XI ARM (DISPOSABLE) ×4
DRAPE DA VINCI XI COLUMN (DISPOSABLE) ×1
DRAPE SURG IRRIG POUCH 19X23 (DRAPES) ×3 IMPLANT
DRSG TEGADERM 4X4.75 (GAUZE/BANDAGES/DRESSINGS) ×3 IMPLANT
ELECT REM PT RETURN 9FT ADLT (ELECTROSURGICAL) ×3
ELECTRODE REM PT RTRN 9FT ADLT (ELECTROSURGICAL) ×2 IMPLANT
GLOVE BIO SURGEON STRL SZ 6.5 (GLOVE) ×3 IMPLANT
GLOVE BIOGEL M STRL SZ7.5 (GLOVE) ×6 IMPLANT
GOWN STRL REUS W/TWL LRG LVL3 (GOWN DISPOSABLE) ×9 IMPLANT
HOLDER FOLEY CATH W/STRAP (MISCELLANEOUS) ×3 IMPLANT
IRRIG SUCT STRYKERFLOW 2 WTIP (MISCELLANEOUS) ×3
IRRIGATION SUCT STRKRFLW 2 WTP (MISCELLANEOUS) ×2 IMPLANT
IV LACTATED RINGERS 1000ML (IV SOLUTION) ×1 IMPLANT
NDL SAFETY ECLIPSE 18X1.5 (NEEDLE) ×2 IMPLANT
NEEDLE HYPO 18GX1.5 SHARP (NEEDLE) ×3
PACK ROBOT UROLOGY CUSTOM (CUSTOM PROCEDURE TRAY) ×3 IMPLANT
RELOAD GREEN ECHELON 45 (STAPLE) ×3 IMPLANT
SEAL CANN UNIV 5-8 DVNC XI (MISCELLANEOUS) ×8 IMPLANT
SEAL XI 5MM-8MM UNIVERSAL (MISCELLANEOUS) ×4
SOLUTION ELECTROLUBE (MISCELLANEOUS) ×3 IMPLANT
SUT ETHILON 3 0 PS 1 (SUTURE) ×3 IMPLANT
SUT MNCRL 3 0 RB1 (SUTURE) ×2 IMPLANT
SUT MNCRL 3 0 VIOLET RB1 (SUTURE) ×2 IMPLANT
SUT MNCRL AB 4-0 PS2 18 (SUTURE) ×6 IMPLANT
SUT MONOCRYL 3 0 RB1 (SUTURE) ×2
SUT VIC AB 0 CT1 27 (SUTURE) ×3
SUT VIC AB 0 CT1 27XBRD ANTBC (SUTURE) ×2 IMPLANT
SUT VIC AB 0 UR5 27 (SUTURE) ×3 IMPLANT
SUT VIC AB 2-0 SH 27 (SUTURE) ×3
SUT VIC AB 2-0 SH 27X BRD (SUTURE) ×2 IMPLANT
SUT VICRYL 0 UR6 27IN ABS (SUTURE) ×6 IMPLANT
SYR 27GX1/2 1ML LL SAFETY (SYRINGE) ×3 IMPLANT
TOWEL OR 17X26 10 PK STRL BLUE (TOWEL DISPOSABLE) ×3 IMPLANT
TOWEL OR NON WOVEN STRL DISP B (DISPOSABLE) ×3 IMPLANT
TUBING INSUFFLATION 10FT LAP (TUBING) IMPLANT
WATER STERILE IRR 1500ML POUR (IV SOLUTION) ×4 IMPLANT

## 2016-07-05 NOTE — Anesthesia Procedure Notes (Signed)
Procedure Name: Intubation Date/Time: 07/05/2016 12:28 PM Performed by: Montel Clock Pre-anesthesia Checklist: Patient identified, Emergency Drugs available, Suction available, Patient being monitored and Timeout performed Patient Re-evaluated:Patient Re-evaluated prior to inductionOxygen Delivery Method: Circle system utilized Preoxygenation: Pre-oxygenation with 100% oxygen Intubation Type: IV induction Ventilation: Mask ventilation without difficulty and Oral airway inserted - appropriate to patient size Laryngoscope Size: Mac and 3 Grade View: Grade I Tube type: Oral Tube size: 7.5 mm Number of attempts: 1 Airway Equipment and Method: Stylet Placement Confirmation: ETT inserted through vocal cords under direct vision,  positive ETCO2 and breath sounds checked- equal and bilateral Secured at: 23 cm Tube secured with: Tape Dental Injury: Teeth and Oropharynx as per pre-operative assessment

## 2016-07-05 NOTE — Discharge Instructions (Signed)

## 2016-07-05 NOTE — Op Note (Signed)
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 Robotic assisted laparoscopic radical prostatectomy (bilateral nerve sparing) 2. Bilateral robotic assisted laparoscopic pelvic lymphadenectomy  Surgeon: Pryor Curia. M.D.  Assistant: Debbrah Alar, PA-C  An assistant was required for this surgical procedure.  The duties of the assistant included but were not limited to suctioning, passing suture, camera manipulation, retraction. This procedure would not be able to be performed without an Environmental consultant.  Resident: Dr. Cleotis Lema  Anesthesia: General  Complications: None  EBL: 100 mL  IVF:  1700 mL crystalloid  Specimens: 1. Prostate and seminal vesicles 2. Right pelvic lymph nodes 3. Left pelvic lymph nodes  Disposition of specimens: Pathology  Drains: 1. 20 Fr coude catheter 2. # 19 Blake pelvic drain  Indication: Jeremy Ruiz is a 71 y.o. year old patient with clinically localized prostate cancer.  After a thorough review of the management options for treatment of prostate cancer, he elected to proceed with surgical therapy and the above procedure(s).  We have discussed the potential benefits and risks of the procedure, side effects of the proposed treatment, the likelihood of the patient achieving the goals of the procedure, and any potential problems that might occur during the procedure or recuperation. Informed consent has been obtained.  Description of procedure:  The patient was taken to the operating room and a general anesthetic was administered. He was given preoperative antibiotics, placed in the dorsal lithotomy position, and prepped and draped in the usual sterile fashion. Next a preoperative timeout was performed. A urethral catheter was placed into the bladder and a site was selected  near the umbilicus for placement of the camera port. This was placed using a standard open Hassan technique which allowed entry into the peritoneal cavity under direct vision and without difficulty. An 8 mm robotic port was placed and a pneumoperitoneum established. The camera was then used to inspect the abdomen and there was no evidence of any intra-abdominal injuries or other abnormalities. The remaining abdominal ports were then placed. 8 mm robotic ports were placed in the right lower quadrant, left lower quadrant, and far left lateral abdominal wall. A 5 mm port was placed in the right upper quadrant and a 12 mm port was placed in the right lateral abdominal wall for laparoscopic assistance. All ports were placed under direct vision without difficulty. The surgical cart was then docked.   Utilizing the cautery scissors, the bladder was reflected posteriorly allowing entry into the space of Retzius and identification of the endopelvic fascia and prostate. The periprostatic fat was then removed from the prostate allowing full exposure of the endopelvic fascia. The endopelvic fascia was then incised from the apex back to the base of the prostate bilaterally and the underlying levator muscle fibers were swept laterally off the prostate thereby isolating the dorsal venous complex. The dorsal vein was then stapled and divided with a 45 mm Flex Echelon stapler. Attention then turned to the bladder neck which was divided anteriorly thereby allowing entry into the bladder and exposure of the urethral catheter. The catheter balloon was deflated and the catheter was brought into the operative field and used to retract the prostate anteriorly. The posterior bladder neck was then examined and was divided allowing further dissection between the bladder and prostate posteriorly until the vasa deferentia and seminal vessels were identified. The vasa deferentia were isolated,  divided, and lifted anteriorly. The seminal  vesicles were dissected down to their tips with care to control the seminal vascular arterial blood supply. These structures were then lifted anteriorly and the space between Denonvillier's fascia and the anterior rectum was developed with a combination of sharp and blunt dissection. This isolated the vascular pedicles of the prostate.  The lateral prostatic fascia was then sharply incised allowing release of the neurovascular bundles bilaterally. The vascular pedicles of the prostate were then ligated with Weck clips between the prostate and neurovascular bundles and divided with sharp cold scissor dissection resulting in neurovascular bundle preservation. The neurovascular bundles were then separated off the apex of the prostate and urethra bilaterally.  The urethra was then sharply transected allowing the prostate specimen to be disarticulated. The pelvis was copiously irrigated and hemostasis was ensured. There was no evidence for rectal injury.  Attention then turned to the right pelvic sidewall. The fibrofatty tissue between the external iliac vein, confluence of the iliac vessels, hypogastric artery, and Cooper's ligament was dissected free from the pelvic sidewall with care to preserve the obturator nerve. Weck clips were used for lymphostasis and hemostasis. An identical procedure was performed on the contralateral side and the lymphatic packets were removed for permanent pathologic analysis.  Attention then turned to the urethral anastomosis. A 2-0 Vicryl slip knot was placed between Denonvillier's fascia, the posterior bladder neck, and the posterior urethra to reapproximate these structures. A double-armed 3-0 Monocryl suture was then used to perform a 360 running tension-free anastomosis between the bladder neck and urethra. A new urethral catheter was then placed into the bladder and irrigated. There were no blood clots within the bladder and the anastomosis appeared to be watertight. A #19  Blake drain was then brought through the left lateral 8 mm port site and positioned appropriately within the pelvis. It was secured to the skin with a nylon suture. The surgical cart was then undocked. The right lateral 12 mm port site was closed at the fascial level with a 0 Vicryl suture placed laparoscopically. All remaining ports were then removed under direct vision. The prostate specimen was removed intact within the Endopouch retrieval bag via the periumbilical camera port site. This fascial opening was closed with two running 0 Vicryl sutures. 0.25% Marcaine was then injected into all port sites and all incisions were reapproximated at the skin level with 4-0 Monocryl subcuticular sutures and Liquiband. The patient appeared to tolerate the procedure well and without complications. The patient was able to be extubated and transferred to the recovery unit in satisfactory condition.   Pryor Curia MD

## 2016-07-05 NOTE — Discharge Summary (Signed)
Physician Discharge Summary   Date of admission: 07/05/2016  Date of discharge: 07/06/2016  Admission diagnosis: Prostate Cancer  Discharge diagnosis: Prostate Cancer  History and Physical: For full details, please see admission history and physical. Briefly, Jeremy Ruiz is a 71 y.o. gentleman with localized prostate cancer.  After discussing management/treatment options, he elected to proceed with surgical treatment.  Hospital Course: Tedric Leeth was taken to the operating room on 07/05/2016 and underwent a robotic assisted laparoscopic radical prostatectomy. He tolerated this procedure well and without complications. Postoperatively, he was able to be transferred to a regular hospital room following recovery from anesthesia.  He was able to begin ambulating the night of surgery. He remained hemodynamically stable overnight.  He had excellent urine output with appropriately minimal output from his pelvic drain and his pelvic drain was removed on POD #1.  He was transitioned to oral pain medication, tolerated a clear liquid diet, and had met all discharge criteria and was able to be discharged home later on POD#1.  Laboratory values:  Recent Labs  07/05/16 1639 07/06/16 0452  HGB 11.9* 10.3*  HCT 34.7* 31.2*    Disposition: Home  Discharge instruction: He was instructed to be ambulatory but to refrain from heavy lifting, strenuous activity, or driving. He was instructed on urethral catheter care.  Discharge medications:  Allergies as of 07/06/2016   No Known Allergies     Medication List    STOP taking these medications   multivitamin with minerals Tabs tablet   PROBIOTIC DAILY PO     TAKE these medications   acetaminophen 500 MG tablet Commonly known as:  TYLENOL Take 500-1,000 mg by mouth every 6 (six) hours as needed (for pain.).   hydrochlorothiazide 25 MG tablet Commonly known as:  HYDRODIURIL Take 25 mg by mouth daily.   HYDROcodone-acetaminophen 5-325 MG  tablet Commonly known as:  NORCO Take 1-2 tablets by mouth every 6 (six) hours as needed for moderate pain or severe pain.   LUBRICANT EYE DROPS (PF) OP Place 1-2 drops into both eyes 3 (three) times daily as needed (for dry eyes).   psyllium 58.6 % packet Commonly known as:  METAMUCIL Take 1 packet by mouth daily as needed (for regularity/constipation).   sodium chloride 0.65 % Soln nasal spray Commonly known as:  OCEAN Place 1 spray into both nostrils as needed for congestion.   sulfamethoxazole-trimethoprim 800-160 MG tablet Commonly known as:  BACTRIM DS,SEPTRA DS Take 1 tablet by mouth 2 (two) times daily. Start the day prior to foley removal appointment       Followup: He will followup in 1 week for catheter removal and to discuss his surgical pathology results.

## 2016-07-05 NOTE — Transfer of Care (Signed)
Immediate Anesthesia Transfer of Care Note  Patient: Jeremy Ruiz  Procedure(s) Performed: Procedure(s): XI ROBOTIC ASSISTED LAPAROSCOPIC RADICAL PROSTATECTOMY LEVEL 2 (N/A) PELVIC LYMPHADENECTOMY (Bilateral)  Patient Location: PACU  Anesthesia Type:General  Level of Consciousness:  sedated, patient cooperative and responds to stimulation  Airway & Oxygen Therapy:Patient Spontanous Breathing and Patient connected to face mask oxgen  Post-op Assessment:  Report given to PACU RN and Post -op Vital signs reviewed and stable  Post vital signs:  Reviewed and stable  Last Vitals:  Vitals:   07/05/16 0930  BP: (!) 159/85  Pulse: 89  Resp: 20  Temp: 57.5 C    Complications: No apparent anesthesia complications

## 2016-07-05 NOTE — Anesthesia Postprocedure Evaluation (Signed)
Anesthesia Post Note  Patient: Jeremy Ruiz  Procedure(s) Performed: Procedure(s) (LRB): XI ROBOTIC ASSISTED LAPAROSCOPIC RADICAL PROSTATECTOMY LEVEL 2 (N/A) PELVIC LYMPHADENECTOMY (Bilateral)  Patient location during evaluation: PACU Anesthesia Type: General Level of consciousness: awake and alert Pain management: pain level controlled Vital Signs Assessment: post-procedure vital signs reviewed and stable Respiratory status: spontaneous breathing, nonlabored ventilation, respiratory function stable and patient connected to nasal cannula oxygen Cardiovascular status: blood pressure returned to baseline and stable Postop Assessment: no signs of nausea or vomiting Anesthetic complications: no       Last Vitals:  Vitals:   07/05/16 1715 07/05/16 1730  BP: 140/84 (!) 156/87  Pulse: 90 97  Resp: 13 15  Temp: 36.6 C 37 C    Last Pain:  Vitals:   07/05/16 1730  TempSrc: Oral  PainSc: 2                  Tiajuana Amass

## 2016-07-05 NOTE — Progress Notes (Signed)
Patient ID: Jeremy Ruiz, male   DOB: Aug 23, 1945, 71 y.o.   MRN: [id number]  Post-op note  Subjective: The patient is doing well.  No complaints.  Objective: Vital signs in last 24 hours: Temp:  [97.9 F (36.6 C)-98 F (36.7 C)] 97.9 F (36.6 C) (03/19 1715) Pulse Rate:  [85-90] 90 (03/19 1715) Resp:  [12-20] 13 (03/19 1715) BP: (130-159)/(66-103) 140/84 (03/19 1715) SpO2:  [94 %-100 %] 94 % (03/19 1715) Weight:  [78 kg (172 lb)] 78 kg (172 lb) (03/19 0930)  Intake/Output from previous day: No intake/output data recorded. Intake/Output this shift: Total I/O In: 3150 [I.V.:2150; IV Piggyback:1000] Out: 160 [Urine:40; Drains:20; Blood:100]  Physical Exam:  General: Alert and oriented. Abdomen: Soft, Nondistended. Incisions: Clean and dry. GU: Urine scant, currently receiving IV bolus  Lab Results:  Recent Labs  07/05/16 1639  HGB 11.9*  HCT 34.7*    Assessment/Plan: POD#0   1) Continue to monitor, ambulate, IS tonight   Roxy Horseman, Brooke Bonito. MD   LOS: 0 days   Parth Mccormac,LES 07/05/2016, 5:22 PM

## 2016-07-05 NOTE — Anesthesia Preprocedure Evaluation (Signed)
Anesthesia Evaluation  Patient identified by MRN, date of birth, ID band Patient awake    Reviewed: Allergy & Precautions, NPO status , Patient's Chart, lab work & pertinent test results  Airway Mallampati: II  TM Distance: >3 FB Neck ROM: Full    Dental no notable dental hx. (+) Dental Advisory Given   Pulmonary neg pulmonary ROS,    Pulmonary exam normal breath sounds clear to auscultation       Cardiovascular hypertension, Pt. on medications negative cardio ROS Normal cardiovascular exam Rhythm:Regular Rate:Normal     Neuro/Psych negative neurological ROS  negative psych ROS   GI/Hepatic negative GI ROS, Neg liver ROS,   Endo/Other  negative endocrine ROS  Renal/GU negative Renal ROS  negative genitourinary   Musculoskeletal negative musculoskeletal ROS (+)   Abdominal   Peds negative pediatric ROS (+)  Hematology negative hematology ROS (+)   Anesthesia Other Findings   Reproductive/Obstetrics negative OB ROS                             Lab Results  Component Value Date   WBC 6.5 06/29/2016   HGB 12.6 (L) 06/29/2016   HCT 39.0 06/29/2016   MCV 86.5 06/29/2016   PLT 291 06/29/2016   Lab Results  Component Value Date   CREATININE 1.10 06/29/2016   BUN 25 (H) 06/29/2016   NA 138 06/29/2016   K 4.1 06/29/2016   CL 103 06/29/2016   CO2 28 06/29/2016    Anesthesia Physical  Anesthesia Plan  ASA: II  Anesthesia Plan: General   Post-op Pain Management:    Induction: Intravenous  Airway Management Planned: Oral ETT  Additional Equipment:   Intra-op Plan:   Post-operative Plan: Extubation in OR  Informed Consent: I have reviewed the patients History and Physical, chart, labs and discussed the procedure including the risks, benefits and alternatives for the proposed anesthesia with the patient or authorized representative who has indicated his/her understanding and  acceptance.   Dental advisory given  Plan Discussed with: CRNA and Surgeon  Anesthesia Plan Comments:         Anesthesia Quick Evaluation

## 2016-07-06 DIAGNOSIS — I10 Essential (primary) hypertension: Secondary | ICD-10-CM | POA: Diagnosis not present

## 2016-07-06 DIAGNOSIS — C61 Malignant neoplasm of prostate: Secondary | ICD-10-CM | POA: Diagnosis not present

## 2016-07-06 LAB — HEMOGLOBIN AND HEMATOCRIT, BLOOD
HCT: 31.2 % — ABNORMAL LOW (ref 39.0–52.0)
HEMOGLOBIN: 10.3 g/dL — AB (ref 13.0–17.0)

## 2016-07-06 MED ORDER — HYDROCODONE-ACETAMINOPHEN 5-325 MG PO TABS
1.0000 | ORAL_TABLET | Freq: Four times a day (QID) | ORAL | Status: DC | PRN
  Administered 2016-07-06: 1 via ORAL
  Filled 2016-07-06 (×2): qty 1

## 2016-07-06 MED ORDER — BISACODYL 10 MG RE SUPP
10.0000 mg | Freq: Once | RECTAL | Status: AC
  Administered 2016-07-06: 10 mg via RECTAL
  Filled 2016-07-06: qty 1

## 2016-07-06 NOTE — Progress Notes (Signed)
UROLOGY PROGRESS NOTES  Assessment/Plan: Jeremy Ruiz is a 71 y.o. male with a history of HTN, BPH, diverticulosis, Bell's palsy, prostate cancer who is s/p RALP with bilateral PLND on 07/05/16 with Dr. Alinda Money.   Interval/Plan: NAEON. AFVSS. Adequate UOP. JP with 87cc out. Hb 10.3 from 11.9.   - OOB, ambulate, IS - Saline lock - Continue clear liquids - Discontinue JP drain - POD1 suppository - Expect discharge later today   Subjective: No current pain. No n/v. Tolerating liquids. Already ambulating. No flatus yet.   Objective:  Vital signs in last 24 hours: Temp:  [97.6 F (36.4 C)-99.1 F (37.3 C)] 99.1 F (37.3 C) (03/20 1037) Pulse Rate:  [69-97] 69 (03/20 1037) Resp:  [12-19] 17 (03/20 1037) BP: (117-156)/(66-103) 117/79 (03/20 1037) SpO2:  [94 %-100 %] 97 % (03/20 1037)  03/19 0701 - 03/20 0700 In: 5257.5 [I.V.:4057.5; IV Piggyback:1200] Out: 1277 [Urine:1090; Drains:87; Blood:100]    Physical Exam:  General:  well-developed and well-nourished male in NAD, lying in bed, alert & oriented, pleasant HEENT: [place]/AT, EOMI, sclera anicteric, hearing grossly intact, no nasal discharge, MMM Respiratory: nonlabored respirations, satting well on RA, symmetrical chest rise Cardiovascular: pulse regular rate & rhythm Abdominal: soft, NTTP, nondistended, surgical incisions c/d/i without signs of exudate/erythema GU: Foley draining clear yellow urine, JP drain with SS drainage Extremities: warm, well-perfused, no c/c/e Neuro: no focal deficits   Data Review: Results for orders placed or performed during the hospital encounter of 07/05/16 (from the past 24 hour(s))  Hemoglobin and hematocrit, blood     Status: Abnormal   Collection Time: 07/05/16  4:39 PM  Result Value Ref Range   Hemoglobin 11.9 (L) 13.0 - 17.0 g/dL   HCT 34.7 (L) 39.0 - 52.0 %  Hemoglobin and hematocrit, blood     Status: Abnormal   Collection Time: 07/06/16  4:52 AM  Result Value Ref Range   Hemoglobin 10.3 (L) 13.0 - 17.0 g/dL   HCT 31.2 (L) 39.0 - 52.0 %    Imaging: None

## 2016-07-06 NOTE — Care Management Obs Status (Signed)
[facility] NOTIFICATION   Patient Details  Name: Coreon Simkins MRN: [id number] Date of Birth: 1945/10/29   Medicare Observation Status Notification Given:  Yes    Dessa Phi, RN 07/06/2016, 12:12 PM

## 2016-07-06 NOTE — Progress Notes (Signed)
Patient ID: Jeremy Ruiz, male   DOB: 30-Apr-1945, 71 y.o.   MRN: [id number]  1 Day Post-Op Subjective: The patient is doing well.  No nausea or vomiting. Pain is adequately controlled.  Objective: Vital signs in last 24 hours: Temp:  [97.6 F (36.4 C)-98.8 F (37.1 C)] 97.6 F (36.4 C) (03/20 0619) Pulse Rate:  [72-97] 72 (03/20 0619) Resp:  [12-20] 18 (03/20 0619) BP: (125-159)/(66-103) 125/74 (03/20 0619) SpO2:  [94 %-100 %] 99 % (03/20 0619) Weight:  [78 kg (172 lb)] 78 kg (172 lb) (03/19 0930)  Intake/Output from previous day: 03/19 0701 - 03/20 0700 In: 4057.5 [I.V.:2857.5; IV Piggyback:1200] Out: 1277 [Urine:1090; Drains:87; Blood:100] Intake/Output this shift: No intake/output data recorded.  Physical Exam:  General: Alert and oriented. CV: RRR Lungs: Clear bilaterally. GI: Soft, Nondistended. Incisions: Clean, dry, and intact Urine: Clear Extremities: Nontender, no erythema, no edema.  Lab Results:  Recent Labs  07/05/16 1639 07/06/16 0452  HGB 11.9* 10.3*  HCT 34.7* 31.2*      Assessment/Plan: POD# 1 s/p robotic prostatectomy.  1) SL IVF 2) Ambulate, Incentive spirometry 3) Transition to oral pain medication 4) Dulcolax suppository 5) D/C pelvic drain 6) Plan for likely discharge later today   Pryor Curia. MD   LOS: 0 days   Tonia Avino,LES 07/06/2016, 7:10 AM

## 2016-07-06 NOTE — Care Management Note (Signed)
Case Management Note  Patient Details  Name: Jeremy Ruiz MRN: [id number] Date of Birth: [birth year]  Subjective/Objective: 71 y/o m admitted w/Prostate Ca.From home. POD#1 lap rad prostatectomy. No flatus, advancing diet.                   Action/Plan:d/c plan home.   Expected Discharge Date:                  Expected Discharge Plan:  Home/Self Care  In-House Referral:     Discharge planning Services  CM Consult  Post Acute Care Choice:    Choice offered to:     DME Arranged:    DME Agency:     HH Arranged:    HH Agency:     Status of Service:  In process, will continue to follow  If discussed at Long Length of Stay Meetings, dates discussed:    Additional Comments:  Dessa Phi, RN 07/06/2016, 12:11 PM

## 2016-07-07 ENCOUNTER — Encounter (HOSPITAL_COMMUNITY): Payer: Self-pay | Admitting: Urology

## 2016-08-02 DIAGNOSIS — M6281 Muscle weakness (generalized): Secondary | ICD-10-CM | POA: Diagnosis not present

## 2016-08-02 DIAGNOSIS — N393 Stress incontinence (female) (male): Secondary | ICD-10-CM | POA: Diagnosis not present

## 2016-08-03 DIAGNOSIS — M542 Cervicalgia: Secondary | ICD-10-CM | POA: Diagnosis not present

## 2016-08-03 DIAGNOSIS — M47897 Other spondylosis, lumbosacral region: Secondary | ICD-10-CM | POA: Diagnosis not present

## 2016-08-03 DIAGNOSIS — M50322 Other cervical disc degeneration at C5-C6 level: Secondary | ICD-10-CM | POA: Diagnosis not present

## 2016-08-03 DIAGNOSIS — M47896 Other spondylosis, lumbar region: Secondary | ICD-10-CM | POA: Diagnosis not present

## 2016-08-03 DIAGNOSIS — M4316 Spondylolisthesis, lumbar region: Secondary | ICD-10-CM | POA: Diagnosis not present

## 2016-08-03 DIAGNOSIS — M47892 Other spondylosis, cervical region: Secondary | ICD-10-CM | POA: Diagnosis not present

## 2016-08-03 DIAGNOSIS — M2578 Osteophyte, vertebrae: Secondary | ICD-10-CM | POA: Diagnosis not present

## 2016-08-03 DIAGNOSIS — M5136 Other intervertebral disc degeneration, lumbar region: Secondary | ICD-10-CM | POA: Diagnosis not present

## 2016-08-17 DIAGNOSIS — M6281 Muscle weakness (generalized): Secondary | ICD-10-CM | POA: Diagnosis not present

## 2016-08-17 DIAGNOSIS — N393 Stress incontinence (female) (male): Secondary | ICD-10-CM | POA: Diagnosis not present

## 2016-08-30 DIAGNOSIS — H524 Presbyopia: Secondary | ICD-10-CM | POA: Diagnosis not present

## 2016-08-30 DIAGNOSIS — H52223 Regular astigmatism, bilateral: Secondary | ICD-10-CM | POA: Diagnosis not present

## 2016-08-30 DIAGNOSIS — H35363 Drusen (degenerative) of macula, bilateral: Secondary | ICD-10-CM | POA: Diagnosis not present

## 2016-08-30 DIAGNOSIS — H16223 Keratoconjunctivitis sicca, not specified as Sjogren's, bilateral: Secondary | ICD-10-CM | POA: Diagnosis not present

## 2016-08-30 DIAGNOSIS — H5211 Myopia, right eye: Secondary | ICD-10-CM | POA: Diagnosis not present

## 2016-08-30 DIAGNOSIS — H2513 Age-related nuclear cataract, bilateral: Secondary | ICD-10-CM | POA: Diagnosis not present

## 2016-08-30 DIAGNOSIS — H35371 Puckering of macula, right eye: Secondary | ICD-10-CM | POA: Diagnosis not present

## 2016-08-30 DIAGNOSIS — H5202 Hypermetropia, left eye: Secondary | ICD-10-CM | POA: Diagnosis not present

## 2016-09-20 NOTE — Addendum Note (Signed)
Addendum  created 09/20/16 1014 by Myrtie Soman, MD   Sign clinical note

## 2016-10-13 DIAGNOSIS — C61 Malignant neoplasm of prostate: Secondary | ICD-10-CM | POA: Diagnosis not present

## 2016-10-13 DIAGNOSIS — N5201 Erectile dysfunction due to arterial insufficiency: Secondary | ICD-10-CM | POA: Diagnosis not present

## 2017-01-11 DIAGNOSIS — R1033 Periumbilical pain: Secondary | ICD-10-CM | POA: Diagnosis not present

## 2017-02-28 DIAGNOSIS — H35363 Drusen (degenerative) of macula, bilateral: Secondary | ICD-10-CM | POA: Diagnosis not present

## 2017-02-28 DIAGNOSIS — H16223 Keratoconjunctivitis sicca, not specified as Sjogren's, bilateral: Secondary | ICD-10-CM | POA: Diagnosis not present

## 2017-02-28 DIAGNOSIS — H35371 Puckering of macula, right eye: Secondary | ICD-10-CM | POA: Diagnosis not present

## 2017-03-23 DIAGNOSIS — K439 Ventral hernia without obstruction or gangrene: Secondary | ICD-10-CM | POA: Diagnosis not present

## 2017-05-03 DIAGNOSIS — K439 Ventral hernia without obstruction or gangrene: Secondary | ICD-10-CM | POA: Diagnosis not present

## 2017-05-03 DIAGNOSIS — K432 Incisional hernia without obstruction or gangrene: Secondary | ICD-10-CM | POA: Diagnosis not present

## 2017-05-03 DIAGNOSIS — Z8546 Personal history of malignant neoplasm of prostate: Secondary | ICD-10-CM | POA: Diagnosis not present

## 2017-05-04 DIAGNOSIS — K432 Incisional hernia without obstruction or gangrene: Secondary | ICD-10-CM | POA: Diagnosis not present

## 2017-05-04 DIAGNOSIS — Z8546 Personal history of malignant neoplasm of prostate: Secondary | ICD-10-CM | POA: Diagnosis not present

## 2017-08-25 DIAGNOSIS — H35371 Puckering of macula, right eye: Secondary | ICD-10-CM | POA: Diagnosis not present

## 2017-08-25 DIAGNOSIS — H52223 Regular astigmatism, bilateral: Secondary | ICD-10-CM | POA: Diagnosis not present

## 2017-09-22 DIAGNOSIS — H35372 Puckering of macula, left eye: Secondary | ICD-10-CM | POA: Diagnosis not present

## 2017-09-22 DIAGNOSIS — H35413 Lattice degeneration of retina, bilateral: Secondary | ICD-10-CM | POA: Diagnosis not present

## 2017-09-22 DIAGNOSIS — H43812 Vitreous degeneration, left eye: Secondary | ICD-10-CM | POA: Diagnosis not present

## 2017-09-22 DIAGNOSIS — H353112 Nonexudative age-related macular degeneration, right eye, intermediate dry stage: Secondary | ICD-10-CM | POA: Diagnosis not present

## 2018-01-05 DIAGNOSIS — H52223 Regular astigmatism, bilateral: Secondary | ICD-10-CM | POA: Diagnosis not present

## 2018-01-05 DIAGNOSIS — H16223 Keratoconjunctivitis sicca, not specified as Sjogren's, bilateral: Secondary | ICD-10-CM | POA: Diagnosis not present

## 2018-01-20 DIAGNOSIS — H6123 Impacted cerumen, bilateral: Secondary | ICD-10-CM | POA: Diagnosis not present

## 2018-01-21 ENCOUNTER — Encounter: Payer: Self-pay | Admitting: Emergency Medicine

## 2018-01-21 ENCOUNTER — Emergency Department (INDEPENDENT_AMBULATORY_CARE_PROVIDER_SITE_OTHER)
Admission: EM | Admit: 2018-01-21 | Discharge: 2018-01-21 | Disposition: A | Discharge status: Home / Self Care | Payer: Medicare Other | Source: Home / Self Care | Attending: Family Medicine | Admitting: Family Medicine

## 2018-01-21 DIAGNOSIS — H6123 Impacted cerumen, bilateral: Secondary | ICD-10-CM

## 2018-01-21 NOTE — ED Provider Notes (Signed)
[name]    CSN: [id number] Arrival date & time: 01/21/18  1040     History   Chief Complaint Chief Complaint  Patient presents with  . Cerumen Impaction    HPI Jeremy Ruiz is a 72 y.o. male.   HPI  Jeremy Ruiz is a 72 y.o. male presenting to UC with c/o bilateral cerumen impaction with Left ear feeling more "full" and uncomfortable than the Right.  He was seen at a different urgent care yesterday, where they tried to flush his ear with a "cold solution" and had him start OTC Debrox drops but no relief. He feels like the fullness is worse today. Hx of cerumen impactions about once a year. Denies URI symptoms- cough, congestion, fever, or chills.    Past Medical History:  Diagnosis Date  . Borderline hypertension   . BPH (benign prostatic hyperplasia)   . Diverticulosis of sigmoid colon   . Elevated PSA   . H/O Bell's palsy    04/ 2012  right side  . Hx of adenomatous colonic polyps    05/ 2017  tubular adenoma  . Hypertension   . Nocturia   . Prostate cancer [facility])     Patient Active Problem List   Diagnosis Date Noted  . Prostate cancer ([place]) 06/04/2016  . Hx of adenomatous colonic polyps 09/12/2015    Past Surgical History:  Procedure Laterality Date  . COLONOSCOPY  last one 09-11-2015  . LYMPHADENECTOMY Bilateral 07/05/2016   Procedure: PELVIC LYMPHADENECTOMY;  Surgeon: Raynelle Bring, MD;  Location: [facility];  Service: Urology;  Laterality: Bilateral;  . PROSTATE BIOPSY  10/14/2011   Procedure: BIOPSY TRANSRECTAL ULTRASONIC PROSTATE (TUBP);  Surgeon: Ailene Rud, MD;  Location: [facility];  Service: Urology;  Laterality: N/A;      . PROSTATE BIOPSY N/A 05/07/2016   Procedure: SATURATED PROSTATE BIOPSY;  Surgeon: Carolan Clines, MD;  Location: [facility];  Service: Urology;  Laterality: N/A;  . ROBOT ASSISTED LAPAROSCOPIC RADICAL PROSTATECTOMY N/A 07/05/2016   Procedure: XI ROBOTIC ASSISTED  LAPAROSCOPIC RADICAL PROSTATECTOMY LEVEL 2;  Surgeon: Raynelle Bring, MD;  Location: [facility];  Service: Urology;  Laterality: N/A;  . TRANSTHORACIC ECHOCARDIOGRAM  07-26-2010   NORMAL LVSF/ EF 06-65%   . TRANSURETHRAL RESECTION OF PROSTATE  2010       Home Medications    Prior to Admission medications   Medication Sig Start Date End Date Taking? Authorizing Provider  hydrochlorothiazide (HYDRODIURIL) 25 MG tablet Take 25 mg by mouth daily.     [provider]  Polyethyl Glycol-Propyl Glycol (LUBRICANT EYE DROPS, PF, OP) Place 1-2 drops into both eyes 3 (three) times daily as needed (for dry eyes).    [provider]  psyllium (METAMUCIL) 58.6 % packet Take 1 packet by mouth daily as needed (for regularity/constipation).    [provider]  sodium chloride (OCEAN) 0.65 % SOLN nasal spray Place 1 spray into both nostrils as needed for congestion.    [provider]    Family History Family History  Problem Relation Age of Onset  . Cancer Sister        lung smoker  . Cancer Paternal Uncle        colon/questionable  . Cancer Sister        lung smoker  . Cancer Other        brain  . Colon cancer Neg Hx   . Esophageal cancer Neg Hx   . Pancreatic cancer Neg  Hx   . Prostate cancer Neg Hx   . Rectal cancer Neg Hx   . Stomach cancer Neg Hx     Social History Social History   Tobacco Use  . Smoking status: Never Smoker  . Smokeless tobacco: Never Used  Substance Use Topics  . Alcohol use: No    Alcohol/week: 0.0 standard drinks  . Drug use: No     Allergies   Patient has no known allergies.   Review of Systems Review of Systems  Constitutional: Negative for chills and fever.  HENT: Positive for ear pain (Left) and hearing loss. Negative for tinnitus.   Neurological: Negative for dizziness and headaches.     Physical Exam Triage Vital Signs ED Triage Vitals [01/21/18 1056]  Enc Vitals Group     BP 134/86     Pulse Rate 72      Resp      Temp (!) 97.3 F (36.3 C)     Temp Source Oral     SpO2 100 %     Weight 164 lb 12 oz (74.7 kg)     Height 5\' 8"  (1.727 m)     Head Circumference      Peak Flow      Pain Score 1     Pain Loc      Pain Edu?      Excl. in [place]?    No data found.  Updated Vital Signs BP 134/86 (BP Location: Right Arm)   Pulse 72   Temp (!) 97.3 F (36.3 C) (Oral)   Ht 5\' 8"  (1.727 m)   Wt 164 lb 12 oz (74.7 kg)   SpO2 100%   BMI 25.05 kg/m   Visual Acuity Right Eye Distance:   Left Eye Distance:   Bilateral Distance:    Right Eye Near:   Left Eye Near:    Bilateral Near:     Physical Exam  Constitutional: He is oriented to person, place, and time. He appears well-developed and well-nourished. No distress.  HENT:  Head: Normocephalic and atraumatic.  Right Ear: Tympanic membrane normal. Tympanic membrane is not erythematous and not bulging.  Left Ear: Tympanic membrane normal. Tympanic membrane is not erythematous and not bulging.  Nose: Nose normal.  Mouth/Throat: Uvula is midline, oropharynx is clear and moist and mucous membranes are normal.  Right ear: partial cerumen impaction Left ear: cerumen impaction with soft appearing cerumen.  TMs fully visualized after removal: normal TMs.   Eyes: EOM are normal.  Neck: Normal range of motion.  Cardiovascular: Normal rate.  Pulmonary/Chest: Effort normal.  Musculoskeletal: Normal range of motion.  Neurological: He is alert and oriented to person, place, and time.  Skin: Skin is warm and dry. He is not diaphoretic.  Psychiatric: He has a normal mood and affect. His behavior is normal.  Nursing note and vitals reviewed.    UC Treatments / Results  Labs (all labs ordered are listed, but only abnormal results are displayed) Labs Reviewed - No data to display  EKG None  Radiology No results found.  Procedures Ear Cerumen Removal Date/Time: 01/21/2018 11:58 AM Performed by: Noe Gens, PA-C Authorized by:  Kandra Nicolas, MD   Consent:    Consent obtained:  Verbal   Consent given by:  Patient   Risks discussed:  Bleeding, infection, incomplete removal, pain, TM perforation and dizziness   Alternatives discussed:  No treatment and delayed treatment Procedure details:    Location:  L ear  Procedure type: curette     Procedure type comment:  [place] attempted by provider (myself) and irritation performed by Baxter Flattery, CMA Post-procedure details:    Inspection:  TM intact   Hearing quality:  Improved   Patient tolerance of procedure:  Tolerated well, no immediate complications   (including critical care time)  Medications Ordered in UC Medications - No data to display  Initial Impression / Assessment and Plan / UC Course  I have reviewed the triage vital signs and the nursing notes.  Pertinent labs & imaging results that were available during my care of the patient were reviewed by me and considered in my medical decision making (see chart for details).     Successful removal of cerumen out of both ears  No evidence of underlying infection  Final Clinical Impressions(s) / UC Diagnoses   Final diagnoses:  Impacted cerumen of both ears     Discharge Instructions      You may use the over the counter Debrox drops every other day or once a week to help prevent wax from building up.  Please see additional information in this packet on ways to help prevent wax buildup.     ED Prescriptions    None     Controlled Substance Prescriptions  Controlled Substance Registry consulted? Not Applicable   Noe Gens, PA-C 01/21/18 1159

## 2018-01-21 NOTE — ED Triage Notes (Signed)
Patient c/o left ear being clogged with wax.  Was seen at an urgent care yesterday, tried to irrigate the ear, no relief, drops, actually worse today.

## 2018-01-21 NOTE — Discharge Instructions (Signed)
°  You may use the over the counter Debrox drops every other day or once a week to help prevent wax from building up.  Please see additional information in this packet on ways to help prevent wax buildup.

## 2018-03-14 IMAGING — MR MR PROSTATE WO/W CM
56 series · 56 of 56 positions shown · IV contrast (with contrast)
Comparison: 06/21/2014

CLINICAL DATA: Elevated PSA.  Prior negative biopsy.

EXAM:
MR PROSTATE WITHOUT AND WITH CONTRAST
TECHNIQUE: Multiplanar multisequence MRI images were obtained of the pelvis
centered about the prostate. Pre and post contrast images were
obtained.
CONTRAST:  15mL MULTIHANCE GADOBENATE DIMEGLUMINE 529 MG/ML IV SOLN

[Series 3: T1 · axial · 8.0mm · 1.06mm/px · 1 of 28 slices shown (1 of 2)]
[im 1/28]
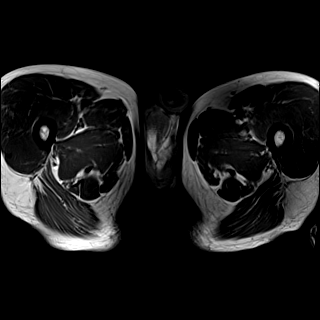

[Series 4: bSSFP fat-sat · axial · 8.0mm · 0.74mm/px · 1 of 28 slices shown]
[im 1/28]
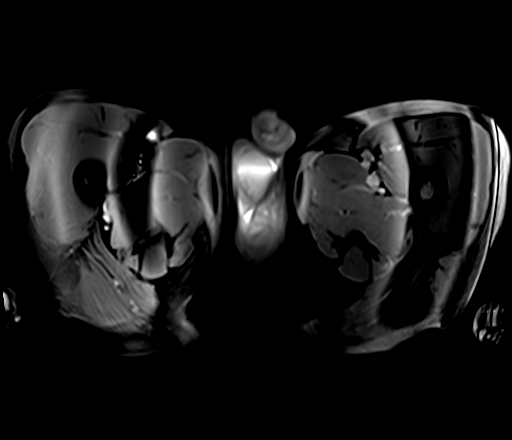

[Series 6: T1 · axial · 3.0mm · 0.31mm/px · 1 of 24 slices shown (2 of 2)]
[im 1/24]
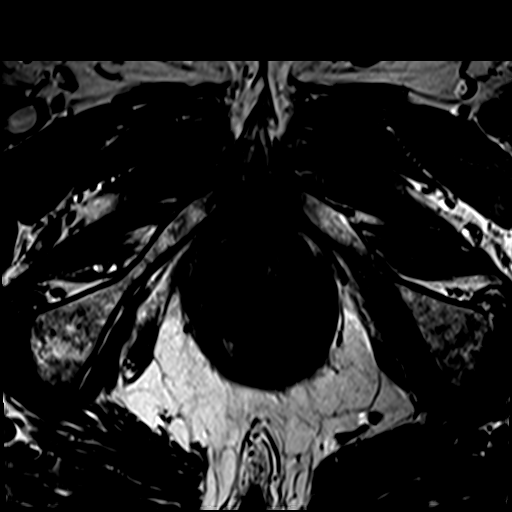

[Series 7: T2 · sagittal · 3.5mm · 0.56mm/px · 1 of 39 slices shown (1 of 4)]
[im 1/39]
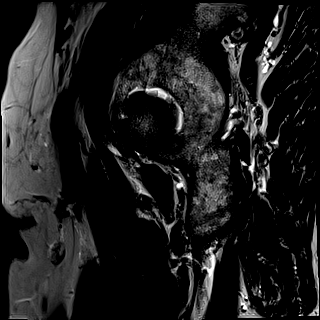

[Series 8: T2 · axial · 3.5mm · 0.56mm/px · 1 of 20 slices shown (2 of 4)]
[im 1/20]
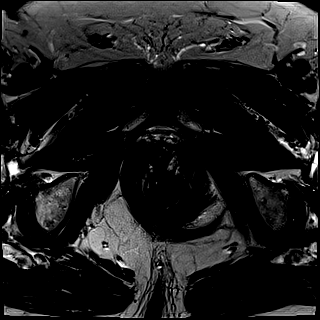

[Series 9: T2 · axial · 1.0mm · 1.04mm/px · 1 of 80 slices shown (3 of 4)]
[im 1/80]
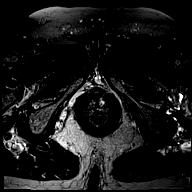

[Series 10: T2 · coronal · 3.5mm · 0.56mm/px · 1 of 20 slices shown (4 of 4)]
[im 1/20]
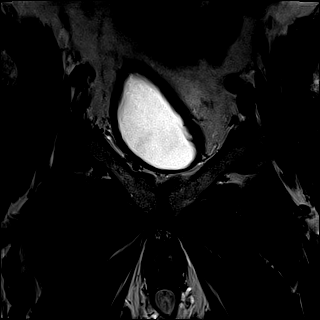

[Series 11: DWI · axial · 3.5mm · 1.56mm/px · 1 of 60 slices shown (1 of 2)]
[im 1/60]
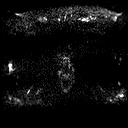

[Series 12: DWI · axial · 3.5mm · 1.56mm/px · 1 of 20 slices shown (2 of 2)]
[im 1/20]
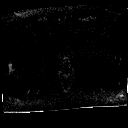

[Series 13: t1_twist_tra_dyn_ttc=5.3s · axial · 3.5mm · 0.83mm/px · 1 of 20 slices shown]
[im 1/20]
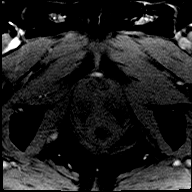

[Series 14: t1_twist_tra_dyn-copy center to · axial · 3.5mm · 0.83mm/px · 1 of 20 slices shown (1 of 24)]
[im 1/20]
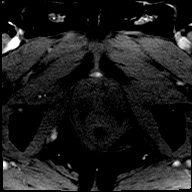

[Series 15: t1_twist_tra_dyn-copy center to · axial · 3.5mm · 0.83mm/px · 1 of 20 slices shown (2 of 24)]
[im 1/20]
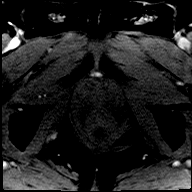

[Series 16: t1_twist_tra_dyn-copy center to_sub_ttc=(id) · axial · 3.5mm · 0.83mm/px · 1 of 20 slices shown (1 of 22)]
[im 1/20]
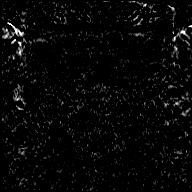

[Series 17: t1_twist_tra_dyn-copy center to · axial · 3.5mm · 0.83mm/px · 1 of 20 slices shown (3 of 24)]
[im 1/20]
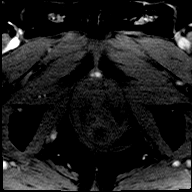

[Series 18: t1_twist_tra_dyn-copy center to_sub_ttc=(id) · axial · 3.5mm · 0.83mm/px · 1 of 20 slices shown (2 of 22)]
[im 1/20]
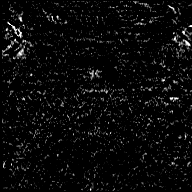

[Series 19: t1_twist_tra_dyn-copy center to · axial · 3.5mm · 0.83mm/px · 1 of 20 slices shown (4 of 24)]
[im 1/20]
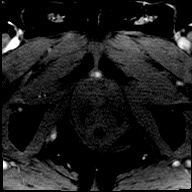

[Series 20: t1_twist_tra_dyn-copy center to_sub_ttc=(id) · axial · 3.5mm · 0.83mm/px · 1 of 20 slices shown (3 of 22)]
[im 1/20]
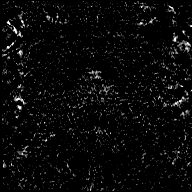

[Series 21: t1_twist_tra_dyn-copy center to · axial · 3.5mm · 0.83mm/px · 1 of 20 slices shown (5 of 24)]
[im 1/20]
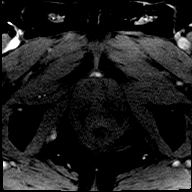

[Series 22: t1_twist_tra_dyn-copy center to_sub_ttc=(id) · axial · 3.5mm · 0.83mm/px · 1 of 20 slices shown (4 of 22)]
[im 1/20]
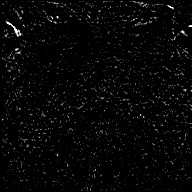

[Series 23: t1_twist_tra_dyn-copy center to · axial · 3.5mm · 0.83mm/px · 1 of 20 slices shown (6 of 24)]
[im 1/20]
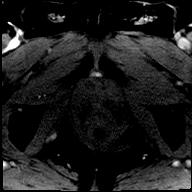

[Series 24: t1_twist_tra_dyn-copy center to_sub_ttc=(id) · axial · 3.5mm · 0.83mm/px · 1 of 20 slices shown (5 of 22)]
[im 1/20]
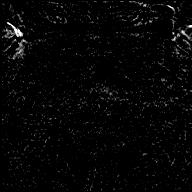

[Series 25: t1_twist_tra_dyn-copy center to · axial · 3.5mm · 0.83mm/px · 1 of 20 slices shown (7 of 24)]
[im 1/20]
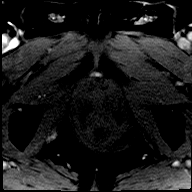

[Series 26: t1_twist_tra_dyn-copy center to_sub_ttc=(id) · axial · 3.5mm · 0.83mm/px · 1 of 20 slices shown (6 of 22)]
[im 1/20]
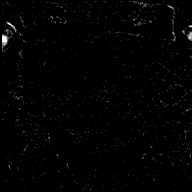

[Series 27: t1_twist_tra_dyn-copy center to · axial · 3.5mm · 0.83mm/px · 1 of 20 slices shown (8 of 24)]
[im 1/20]
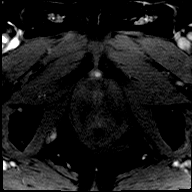

[Series 28: t1_twist_tra_dyn-copy center to_sub_ttc=(id) · axial · 3.5mm · 0.83mm/px · 1 of 20 slices shown (7 of 22)]
[im 1/20]
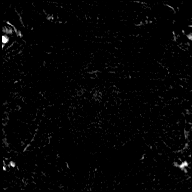

[Series 29: t1_twist_tra_dyn-copy center to · axial · 3.5mm · 0.83mm/px · 1 of 20 slices shown (9 of 24)]
[im 1/20]
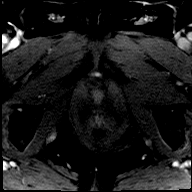

[Series 30: t1_twist_tra_dyn-copy center to_sub_ttc=(id) · axial · 3.5mm · 0.83mm/px · 1 of 20 slices shown (8 of 22)]
[im 1/20]
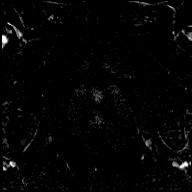

[Series 31: t1_twist_tra_dyn-copy center to · axial · 3.5mm · 0.83mm/px · 1 of 20 slices shown (10 of 24)]
[im 1/20]
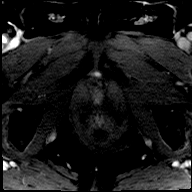

[Series 32: t1_twist_tra_dyn-copy center to_sub_ttc=(id) · axial · 3.5mm · 0.83mm/px · 1 of 20 slices shown (9 of 22)]
[im 1/20]
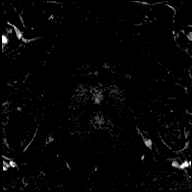

[Series 33: t1_twist_tra_dyn-copy center to · axial · 3.5mm · 0.83mm/px · 1 of 20 slices shown (11 of 24)]
[im 1/20]
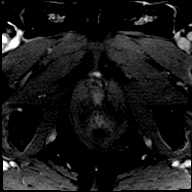

[Series 34: t1_twist_tra_dyn-copy center to_sub_ttc=(id) · axial · 3.5mm · 0.83mm/px · 1 of 20 slices shown (10 of 22)]
[im 1/20]
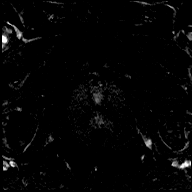

[Series 35: t1_twist_tra_dyn-copy center to · axial · 3.5mm · 0.83mm/px · 1 of 20 slices shown (12 of 24)]
[im 1/20]
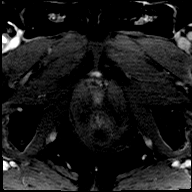

[Series 36: t1_twist_tra_dyn-copy center to_sub_ttc=(id) · axial · 3.5mm · 0.83mm/px · 1 of 20 slices shown (11 of 22)]
[im 1/20]
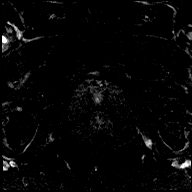

[Series 37: t1_twist_tra_dyn-copy center to · axial · 3.5mm · 0.83mm/px · 1 of 20 slices shown (13 of 24)]
[im 1/20]
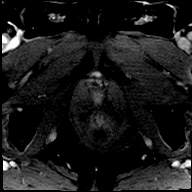

[Series 38: t1_twist_tra_dyn-copy center to_sub_ttc=(id) · axial · 3.5mm · 0.83mm/px · 1 of 20 slices shown (12 of 22)]
[im 1/20]
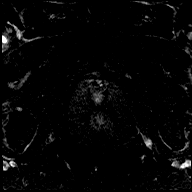

[Series 39: t1_twist_tra_dyn-copy center to · axial · 3.5mm · 0.83mm/px · 1 of 20 slices shown (14 of 24)]
[im 1/20]
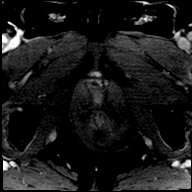

[Series 40: t1_twist_tra_dyn-copy center to_sub_ttc=(id) · axial · 3.5mm · 0.83mm/px · 1 of 20 slices shown (13 of 22)]
[im 1/20]
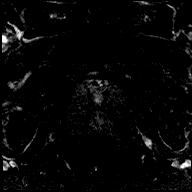

[Series 41: t1_twist_tra_dyn-copy center to · axial · 3.5mm · 0.83mm/px · 1 of 20 slices shown (15 of 24)]
[im 1/20]
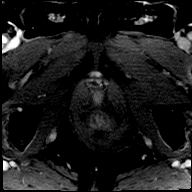

[Series 42: t1_twist_tra_dyn-copy center to_sub_ttc=(id) · axial · 3.5mm · 0.83mm/px · 1 of 20 slices shown (14 of 22)]
[im 1/20]
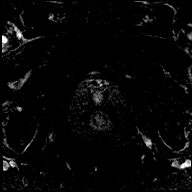

[Series 43: t1_twist_tra_dyn-copy center to · axial · 3.5mm · 0.83mm/px · 1 of 20 slices shown (16 of 24)]
[im 1/20]
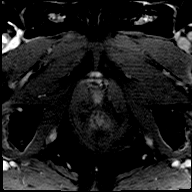

[Series 44: t1_twist_tra_dyn-copy center to_sub_ttc=(id) · axial · 3.5mm · 0.83mm/px · 1 of 20 slices shown (15 of 22)]
[im 1/20]
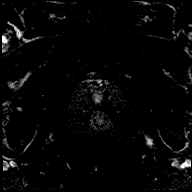

[Series 45: t1_twist_tra_dyn-copy center to · axial · 3.5mm · 0.83mm/px · 1 of 20 slices shown (17 of 24)]
[im 1/20]
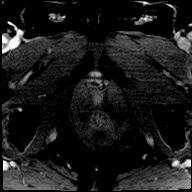

[Series 46: t1_twist_tra_dyn-copy center to_sub_ttc=(id) · axial · 3.5mm · 0.83mm/px · 1 of 20 slices shown (16 of 22)]
[im 1/20]
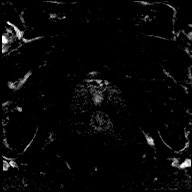

[Series 47: t1_twist_tra_dyn-copy center to · axial · 3.5mm · 0.83mm/px · 1 of 20 slices shown (18 of 24)]
[im 1/20]
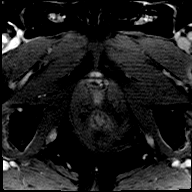

[Series 48: t1_twist_tra_dyn-copy center to_sub_ttc=(id) · axial · 3.5mm · 0.83mm/px · 1 of 20 slices shown (17 of 22)]
[im 1/20]
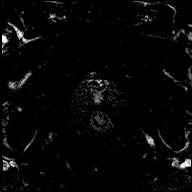

[Series 49: t1_twist_tra_dyn-copy center to · axial · 3.5mm · 0.83mm/px · 1 of 20 slices shown (19 of 24)]
[im 1/20]
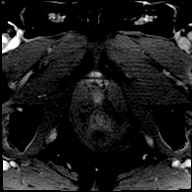

[Series 50: t1_twist_tra_dyn-copy center to_sub_ttc=(id) · axial · 3.5mm · 0.83mm/px · 1 of 20 slices shown (18 of 22)]
[im 1/20]
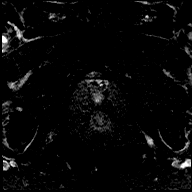

[Series 51: t1_twist_tra_dyn-copy center to · axial · 3.5mm · 0.83mm/px · 1 of 20 slices shown (20 of 24)]
[im 1/20]
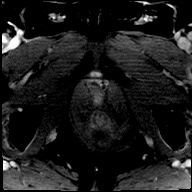

[Series 52: t1_twist_tra_dyn-copy center to_sub_ttc=(id) · axial · 3.5mm · 0.83mm/px · 1 of 20 slices shown (19 of 22)]
[im 1/20]
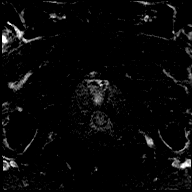

[Series 53: t1_twist_tra_dyn-copy center to · axial · 3.5mm · 0.83mm/px · 1 of 20 slices shown (21 of 24)]
[im 1/20]
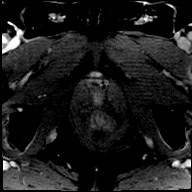

[Series 54: t1_twist_tra_dyn-copy center to_sub_ttc=(id) · axial · 3.5mm · 0.83mm/px · 1 of 20 slices shown (20 of 22)]
[im 1/20]
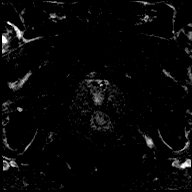

[Series 55: t1_twist_tra_dyn-copy center to · axial · 3.5mm · 0.83mm/px · 1 of 20 slices shown (22 of 24)]
[im 1/20]
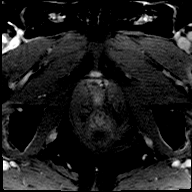

[Series 56: t1_twist_tra_dyn-copy center to_sub_ttc=(id) · axial · 3.5mm · 0.83mm/px · 1 of 20 slices shown (21 of 22)]
[im 1/20]
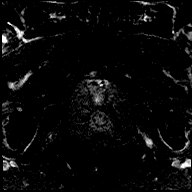

[Series 57: t1_twist_tra_dyn-copy center to · axial · 3.5mm · 0.83mm/px · 1 of 20 slices shown (23 of 24)]
[im 1/20]
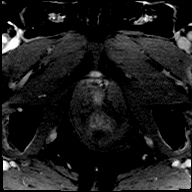

[Series 58: t1_twist_tra_dyn-copy center to_sub_ttc=(id) · axial · 3.5mm · 0.83mm/px · 1 of 20 slices shown (22 of 22)]
[im 1/20]
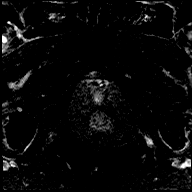

[Series 59: t1_twist_tra_dyn-copy center to · axial · 3.5mm · 0.83mm/px · 1 of 20 slices shown (24 of 24)]
[im 1/20]
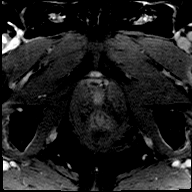

[56 of 56 positions shown; findings below may reference images not displayed]

FINDINGS: Prostate: Demonstrates moderate central gland enlargement
heterogeneity, consistent with benign prostatic hyperplasia.

Involving the anterior left peripheral zone, mid to apical gland, is
an approximately 2.5 x 0.9 cm area of T2 moderate hypointensity
(image 14/series 8), moderately restricted diffusion (image
55/series 11), and early post-contrast enhancement (image 15/series
27). Nonspecific more mild vague post-contrast enhancement seen
bilaterally within the mid to apical gland posteriorly. Example
image 15/series 27.

Transcapsular spread:  Absent

Seminal vesicle involvement: Absent

Neurovascular bundle involvement: Absent

Pelvic adenopathy: Absent

Bone metastasis: Absent

Other findings: No significant free fluid. Sigmoid diverticulosis.
Normal urinary bladder.
IMPRESSION: 1. New or significantly enlarged area of multiparametric signal
abnormality involving the anterior left peripheral zone, mid to
apex. Findings are suspicious for relatively high volume, high grade
disease. [REDACTED]
2.  No evidence of locally advanced or pelvic metastatic disease.

## 2018-06-06 ENCOUNTER — Ambulatory Visit: Payer: Medicare Other | Admitting: Physician Assistant

## 2018-06-09 ENCOUNTER — Ambulatory Visit (INDEPENDENT_AMBULATORY_CARE_PROVIDER_SITE_OTHER): Payer: Medicare Other | Admitting: Physician Assistant

## 2018-06-09 ENCOUNTER — Encounter: Payer: Self-pay | Admitting: Physician Assistant

## 2018-06-09 VITALS — BP 128/72 | HR 76 | Ht 68.0 in | Wt 165.7 lb

## 2018-06-09 DIAGNOSIS — Z860101 Personal history of adenomatous and serrated colon polyps: Secondary | ICD-10-CM

## 2018-06-09 DIAGNOSIS — K5790 Diverticulosis of intestine, part unspecified, without perforation or abscess without bleeding: Secondary | ICD-10-CM | POA: Diagnosis not present

## 2018-06-09 DIAGNOSIS — Z86010 Personal history of colonic polyps: Secondary | ICD-10-CM | POA: Diagnosis not present

## 2018-06-09 DIAGNOSIS — R1032 Left lower quadrant pain: Secondary | ICD-10-CM

## 2018-06-09 MED ORDER — HYOSCYAMINE SULFATE 0.125 MG SL SUBL: 0.1250 mg | SUBLINGUAL_TABLET | Freq: Four times a day (QID) | SUBLINGUAL | 2 refills | Status: DC | PRN

## 2018-06-09 NOTE — Progress Notes (Addendum)
Subjective:    Patient ID: Jeremy Ruiz, male    DOB: 1945/10/28, 73 y.o.   MRN: [id number]  HPI Jeremy Ruiz is a pleasant 73 year old white male, known to Dr. Carlean Purl with history of diverticulosis and adenomatous colon polyps.  He also has history of prostate cancer for which he underwent radical prostatectomy in 2018. He comes in today with complaints of 4 to 5-week history of fairly frequent intermittent abdominal cramping, located primarily in the left lower quadrant.  This seems to be exacerbated by drinking coffee in the morning and may bother him for an hour or so once it starts.  He says usually cramping subsides after he has a bowel movement.  He has not noticed any change in his bowel habits, no constipation diarrhea melena or hematochezia.  He is not having any abdominal bloating or distention.  He says he is not been very good about eating a healthy diet though he has been taking Metamucil on a regular basis. He denies any constant discomfort. Last had colonoscopy in May 2017, he had 2 polyps which were 4 to 10 mm in size and 2 polyps which were 2 to 3 mm in size.  He was noted to have multiple diverticuli primarily in the sigmoid colon, narrowing of the colon in this area.  Path on the polyp showed 2 tubular adenomas and one sessile serrated polyp from the a sending colon.  He was indicated for 3-year interval follow-up.   Review of Systems Pertinent positive and negative review of systems were noted in the above HPI section.  All other review of systems was otherwise negative.  Outpatient Encounter Medications as of 06/09/2018  Medication Sig  . hydrochlorothiazide (HYDRODIURIL) 25 MG tablet Take 25 mg by mouth daily.   Jeremy Ruiz (LUBRICANT EYE DROPS, PF, OP) Place 1-2 drops into both eyes 3 (three) times daily as needed (for dry eyes).  . psyllium (METAMUCIL) 58.6 % packet Take 1 packet by mouth daily as needed (for regularity/constipation).  . sodium chloride  (OCEAN) 0.65 % SOLN nasal spray Place 1 spray into both nostrils as needed for congestion.  . hyoscyamine (LEVSIN SL) 0.125 MG SL tablet Place 1 tablet (0.125 mg total) under the tongue every 6 (six) hours as needed. Take one every six hours as needed for abdominal cramping   No facility-administered encounter medications on file as of 06/09/2018.    No Known Allergies Patient Active Problem List   Diagnosis Date Noted  . Prostate cancer ([place]) 06/04/2016  . Hx of adenomatous colonic polyps 09/12/2015   Social History   Socioeconomic History  . Marital status: Married    Spouse name: Not on file  . Number of children: Not on file  . Years of education: Not on file  . Highest education level: Not on file  Occupational History  . Not on file  Social Needs  . Financial resource strain: Not on file  . Food insecurity:    Worry: Not on file    Inability: Not on file  . Transportation needs:    Medical: Not on file    Non-medical: Not on file  Tobacco Use  . Smoking status: Never Smoker  . Smokeless tobacco: Never Used  Substance and Sexual Activity  . Alcohol use: No    Alcohol/week: 0.0 standard drinks  . Drug use: No  . Sexual activity: Yes  Lifestyle  . Physical activity:    Days per week: Not on file    Minutes  per session: Not on file  . Stress: Not on file  Relationships  . Social connections:    Talks on phone: Not on file    Gets together: Not on file    Attends religious service: Not on file    Active member of club or organization: Not on file    Attends meetings of clubs or organizations: Not on file    Relationship status: Not on file  . Intimate partner violence:    Fear of current or ex partner: Not on file    Emotionally abused: Not on file    Physically abused: Not on file    Forced sexual activity: Not on file  Other Topics Concern  . Not on file  Social History Narrative  . Not on file    Jeremy Ruiz's family history includes Cancer in his  paternal uncle, sister, sister, and another family member.      Objective:    Vitals:   06/09/18 0958  BP: 128/72  Pulse: 76    Physical Exam; well-developed older white male in no acute distress, pleasant.  Height 5 foot 8, weight 165, BMI 25.1.  HEENT; nontraumatic normocephalic EOMI PERRLA sclera anicteric, Oral mucosa moist, Cardiovascular; regular rate and rhythm with S1-S2 no murmur rub or gallop.  Pulmonary; clear bilaterally.  Abdomen; soft, nondistended nontender, no palpable mass or hepatosplenomegaly bowel sounds are present.  Rectal; exam not done, Extremities; no clubbing cyanosis or edema skin warm and dry, Neuropsych ;and oriented, grossly nonfocal mood and affect appropriate       Assessment & Plan:   #73 73 year old white male with 4 to 5-week history of intermittent crampy left lower quadrant discomfort usually relieved by defecation. Suspect he has symptomatic diverticulosis, rule out progression of luminal narrowing noted in area of sigmoid diverticulosis at the time of last colonoscopy.  He is nontender, today no evidence for diverticulitis.  #2 history of adenomatous and sessile serrated colon polyps.  Due for follow-up colonoscopy May 2020 #3 history of prostate cancer status post prostatectomy 2018 #4.  Status post ventral hernia repair.  Plan; General  management of diverticulosis was discussed in detail with patient today.  He is encouraged to eat a high-fiber diet continue daily fiber supplement in the form of Metamucil or Benefiber.  Liberal water intake, at least 60 ounces per day. We discussed general avoidance of nuts and popcorn.  Start  Levsin sublingual, dissolve 1 on the tongue every 6 hours as needed for abdominal cramping. Patient mentions that he is going out of the country towards the end of March and is hoping to have his abdominal situation under better control.  Patient will be scheduled for Colonoscopy with Dr. Carlean Purl.  Procedure was  discussed in detail with the patient including indications risks and benefits and he is agreeable to proceed.  Jeremy Ruiz S Jeremy Baldyga PA-C 06/09/2018   Cc: Raeanne Gathers, MD   Agree with Ms. Genia Harold assessment and plan. Gatha Mayer, MD, [facility]

## 2018-06-09 NOTE — Patient Instructions (Addendum)
If you are age 74 or older, your body mass index should be between 23-30. Your Body mass index is 25.19 kg/m. If this is out of the aforementioned range listed, please consider follow up with your Primary Care Provider.  If you are age 39 or younger, your body mass index should be between 19-25. Your Body mass index is 25.19 kg/m. If this is out of the aformentioned range listed, please consider follow up with your Primary Care Provider.   You have been scheduled for a colonoscopy. Please follow written instructions given to you at your visit today.  Please pick up your prep supplies at the pharmacy within the next 1-3 days. If you use inhalers (even only as needed), please bring them with you on the day of your procedure. Your physician has requested that you go to www.startemmi.com and enter the access code given to you at your visit today. This web site gives a general overview about your procedure. However, you should still follow specific instructions given to you by our office regarding your preparation for the procedure.  We have sent the following medications to your pharmacy for you to pick up at your convenience: Levsin  Start high fiber diet.  Increase water intake to 60 ounces daily.  Thank you for choosing me and [facility].   Amy Esterwood, PA-C

## 2018-06-22 ENCOUNTER — Telehealth: Payer: Self-pay | Admitting: Physician Assistant

## 2018-06-22 NOTE — Telephone Encounter (Signed)
Pt called in wanting to speak with someone about his procd that is sched for tomorrow. He advised that he has a temp. Of 99.1 and need to know if he should cancel.

## 2018-06-22 NOTE — Telephone Encounter (Signed)
Discussed with Dr. Carlean Purl, Okay to proceed with procedure unless pt develops temp 100.5 or greater. Should he develop other flu like symptoms or fever, he needs to plan to cancel procedure and call [phone] in the morning to reschedule procedure.

## 2018-06-23 ENCOUNTER — Other Ambulatory Visit: Payer: Self-pay

## 2018-06-23 ENCOUNTER — Encounter: Payer: Self-pay | Admitting: Internal Medicine

## 2018-06-23 ENCOUNTER — Ambulatory Visit (AMBULATORY_SURGERY_CENTER): Payer: Medicare Other | Admitting: Internal Medicine

## 2018-06-23 VITALS — BP 108/72 | HR 78 | Temp 99.1°F | Resp 16 | Ht 68.0 in | Wt 165.0 lb

## 2018-06-23 DIAGNOSIS — D122 Benign neoplasm of ascending colon: Secondary | ICD-10-CM

## 2018-06-23 DIAGNOSIS — Z86010 Personal history of colonic polyps: Secondary | ICD-10-CM | POA: Diagnosis not present

## 2018-06-23 DIAGNOSIS — R1032 Left lower quadrant pain: Secondary | ICD-10-CM

## 2018-06-23 DIAGNOSIS — D123 Benign neoplasm of transverse colon: Secondary | ICD-10-CM | POA: Diagnosis not present

## 2018-06-23 DIAGNOSIS — Z860101 Personal history of adenomatous and serrated colon polyps: Secondary | ICD-10-CM

## 2018-06-23 MED ORDER — SODIUM CHLORIDE 0.9 % IV SOLN: 500.0000 mL | Freq: Once | INTRAVENOUS | Status: DC

## 2018-06-23 MED ORDER — FLEET ENEMA 7-19 GM/118ML RE ENEM
1.0000 | ENEMA | Freq: Once | RECTAL | Status: AC
  Administered 2018-06-23: 1 via RECTAL

## 2018-06-23 NOTE — Progress Notes (Signed)
Called to room to assist during endoscopic procedure.  Patient ID and intended procedure confirmed with present staff. Received instructions for my participation in the procedure from the performing physician.  

## 2018-06-23 NOTE — Progress Notes (Signed)
A/ox3, pleased with MAC, report to RN 

## 2018-06-23 NOTE — Patient Instructions (Addendum)
   I found and removed 2 tiny polyps. Also saw diverticulosis.  Nothing bad.  I will let you know results - but probably won't need another routine colonoscopy.  I appreciate the opportunity to care for you. Gatha Mayer, MD, FACG YOU HAD AN ENDOSCOPIC PROCEDURE TODAY AT [facility]:   Refer to the procedure report that was given to you for any specific questions about what was found during the examination.  If the procedure report does not answer your questions, please call your gastroenterologist to clarify.  If you requested that your care partner not be given the details of your procedure findings, then the procedure report has been included in a sealed envelope for you to review at your convenience later.  YOU SHOULD EXPECT: Some feelings of bloating in the abdomen. Passage of more gas than usual.  Walking can help get rid of the air that was put into your GI tract during the procedure and reduce the bloating. If you had a lower endoscopy (such as a colonoscopy or flexible sigmoidoscopy) you may notice spotting of blood in your stool or on the toilet paper. If you underwent a bowel prep for your procedure, you may not have a normal bowel movement for a few days.  Please Note:  You might notice some irritation and congestion in your nose or some drainage.  This is from the oxygen used during your procedure.  There is no need for concern and it should clear up in a day or so.  SYMPTOMS TO REPORT IMMEDIATELY:   Following lower endoscopy (colonoscopy or flexible sigmoidoscopy):  Excessive amounts of blood in the stool  Significant tenderness or worsening of abdominal pains  Swelling of the abdomen that is new, acute  Fever of 100F or higher   For urgent or emergent issues, a gastroenterologist can be reached at any hour by calling [phone].   DIET:  We do recommend a small meal at first, but then you may proceed to your regular diet.  Drink plenty of  fluids but you should avoid alcoholic beverages for 24 hours.  ACTIVITY:  You should plan to take it easy for the rest of today and you should NOT DRIVE or use heavy machinery until tomorrow (because of the sedation medicines used during the test).    FOLLOW UP: Our staff will call the number listed on your records the next business day following your procedure to check on you and address any questions or concerns that you may have regarding the information given to you following your procedure. If we do not reach you, we will leave a message.  However, if you are feeling well and you are not experiencing any problems, there is no need to return our call.  We will assume that you have returned to your regular daily activities without incident.  If any biopsies were taken you will be contacted by phone or by letter within the next 1-3 weeks.  Please call us at [phone] if you have not heard about the biopsies in 3 weeks.    SIGNATURES/CONFIDENTIALITY: You and/or your care partner have signed paperwork which will be entered into your electronic medical record.  These signatures attest to the fact that that the information above on your After Visit Summary has been reviewed and is understood.  Full responsibility of the confidentiality of this discharge information lies with you and/or your care-partner.  Polyp and diverticulosis information given

## 2018-06-23 NOTE — Progress Notes (Signed)
After fleets given, stool with yellow/ tan liquid. SM

## 2018-06-23 NOTE — Progress Notes (Signed)
Fleets enema ordered and given. SM

## 2018-06-23 NOTE — Progress Notes (Signed)
PTs states that he didn't tolerate the prep so well this time and struggled with nausea and vomiting. He reports cloudy brown liquid results from the prep. Will notify Dr. Carlean Purl. He also has a non productive dry cough with no fever for a few weeks. SM

## 2018-06-23 NOTE — Op Note (Signed)
[facility] Patient Name: Jeremy Ruiz Procedure Date: 06/23/2018 2:07 PM MRN: [id number] Endoscopist: Gatha Mayer , MD Age: 73 Referring MD:  Date of Birth: 01-27-1946 Gender: Male Account #: [id number] Procedure:                Colonoscopy Indications:              Surveillance: Personal history of adenomatous                            polyps on last colonoscopy > 3 years ago Medicines:                Propofol per Anesthesia, Monitored Anesthesia Care Procedure:                Pre-Anesthesia Assessment:                           - Prior to the procedure, a History and Physical                            was performed, and patient medications and                            allergies were reviewed. The patient's tolerance of                            previous anesthesia was also reviewed. The risks                            and benefits of the procedure and the sedation                            options and risks were discussed with the patient.                            All questions were answered, and informed consent                            was obtained. Prior Anticoagulants: The patient has                            taken no previous anticoagulant or antiplatelet                            agents. ASA Grade Assessment: III - A patient with                            severe systemic disease. After reviewing the risks                            and benefits, the patient was deemed in                            satisfactory condition to undergo the procedure.  After obtaining informed consent, the colonoscope                            was passed under direct vision. Throughout the                            procedure, the patient's blood pressure, pulse, and                            oxygen saturations were monitored continuously. The                            Colonoscope was introduced through the anus and   advanced to the the cecum, identified by                            appendiceal orifice and ileocecal valve. The                            colonoscopy was performed without difficulty. The                            patient tolerated the procedure well. The quality                            of the bowel preparation was adequate. The                            ileocecal valve, appendiceal orifice, and rectum                            were photographed. The bowel preparation used was                            Miralax. Scope In: 2:14:17 PM Scope Out: 2:33:38 PM Scope Withdrawal Time: 0 hours 15 minutes 52 seconds  Total Procedure Duration: 0 hours 19 minutes 21 seconds  Findings:                 The perianal examination was normal.                           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                           A 3 mm polyp was found in the transverse colon. The                            polyp was sessile. The polyp was removed with a                            cold snare. Resection and retrieval were complete.  Verification of patient identification for the                            specimen was done. Estimated blood loss was minimal.                           A 1 mm polyp was found in the ascending colon. The                            polyp was sessile. The polyp was removed with a                            cold biopsy forceps. Resection and retrieval were                            complete. Verification of patient identification                            for the specimen was done.                           Multiple diverticula were found in the sigmoid                            colon.                           The exam was otherwise without abnormality on                            direct and retroflexion views. Complications:            No immediate complications. Estimated Blood  Loss:     Estimated blood loss was minimal. 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                           - One 3 mm polyp in the transverse colon, removed                            with a cold snare. Resected and retrieved.                           - One 1 mm polyp in the ascending colon, removed                            with a cold biopsy forceps. Resected and retrieved.                           - Moderate diverticulosis in the sigmoid colon.                           -  The examination was otherwise normal on direct                            and retroflexion views.                           - Personal history of colonic polyps. Recommendation:           - Patient has a contact number available for                            emergencies. The signs and symptoms of potential                            delayed complications were discussed with the                            patient. Return to normal activities tomorrow.                            Written discharge instructions were provided to the                            patient.                           - Resume previous diet.                           - Continue present medications.                           - No recommendation at this time regarding repeat                            colonoscopy due to age. Gatha Mayer, MD 06/23/2018 2:48:20 PM This report has been signed electronically.

## 2018-06-26 ENCOUNTER — Telehealth: Payer: Self-pay

## 2018-06-26 NOTE — Telephone Encounter (Signed)
No voicemail - unable to leave message

## 2018-06-26 NOTE — Telephone Encounter (Signed)
No voicemail and unable to leave message. 

## 2018-07-01 ENCOUNTER — Encounter: Payer: Self-pay | Admitting: Internal Medicine

## 2018-07-01 NOTE — Progress Notes (Signed)
1 and 3 mm adenoma No recall due to age at time of recommended interval exam

## 2018-08-31 DIAGNOSIS — H5211 Myopia, right eye: Secondary | ICD-10-CM | POA: Diagnosis not present

## 2018-08-31 DIAGNOSIS — H35363 Drusen (degenerative) of macula, bilateral: Secondary | ICD-10-CM | POA: Diagnosis not present

## 2018-09-28 DIAGNOSIS — H353121 Nonexudative age-related macular degeneration, left eye, early dry stage: Secondary | ICD-10-CM | POA: Diagnosis not present

## 2018-09-28 DIAGNOSIS — H35413 Lattice degeneration of retina, bilateral: Secondary | ICD-10-CM | POA: Diagnosis not present

## 2018-09-28 DIAGNOSIS — H353112 Nonexudative age-related macular degeneration, right eye, intermediate dry stage: Secondary | ICD-10-CM | POA: Diagnosis not present

## 2018-09-28 DIAGNOSIS — H35373 Puckering of macula, bilateral: Secondary | ICD-10-CM | POA: Diagnosis not present

## 2018-12-04 DIAGNOSIS — Z20828 Contact with and (suspected) exposure to other viral communicable diseases: Secondary | ICD-10-CM | POA: Diagnosis not present

## 2018-12-04 DIAGNOSIS — R05 Cough: Secondary | ICD-10-CM | POA: Diagnosis not present

## 2018-12-06 DIAGNOSIS — M47817 Spondylosis without myelopathy or radiculopathy, lumbosacral region: Secondary | ICD-10-CM | POA: Diagnosis not present

## 2018-12-06 DIAGNOSIS — M545 Low back pain: Secondary | ICD-10-CM | POA: Diagnosis not present

## 2018-12-06 DIAGNOSIS — R05 Cough: Secondary | ICD-10-CM | POA: Diagnosis not present

## 2018-12-06 DIAGNOSIS — M47816 Spondylosis without myelopathy or radiculopathy, lumbar region: Secondary | ICD-10-CM | POA: Diagnosis not present

## 2018-12-06 DIAGNOSIS — M4316 Spondylolisthesis, lumbar region: Secondary | ICD-10-CM | POA: Diagnosis not present

## 2018-12-06 DIAGNOSIS — M5136 Other intervertebral disc degeneration, lumbar region: Secondary | ICD-10-CM | POA: Diagnosis not present

## 2019-03-10 DIAGNOSIS — Z23 Encounter for immunization: Secondary | ICD-10-CM | POA: Diagnosis not present

## 2019-03-17 DIAGNOSIS — R200 Anesthesia of skin: Secondary | ICD-10-CM | POA: Diagnosis not present

## 2019-03-17 DIAGNOSIS — M2578 Osteophyte, vertebrae: Secondary | ICD-10-CM | POA: Diagnosis not present

## 2019-03-17 DIAGNOSIS — M4312 Spondylolisthesis, cervical region: Secondary | ICD-10-CM | POA: Diagnosis not present

## 2019-03-17 DIAGNOSIS — M5412 Radiculopathy, cervical region: Secondary | ICD-10-CM | POA: Diagnosis not present

## 2019-03-17 DIAGNOSIS — M47812 Spondylosis without myelopathy or radiculopathy, cervical region: Secondary | ICD-10-CM | POA: Diagnosis not present

## 2019-03-17 DIAGNOSIS — M5030 Other cervical disc degeneration, unspecified cervical region: Secondary | ICD-10-CM | POA: Diagnosis not present

## 2019-03-17 DIAGNOSIS — M5410 Radiculopathy, site unspecified: Secondary | ICD-10-CM | POA: Diagnosis not present

## 2019-03-24 DIAGNOSIS — Z20828 Contact with and (suspected) exposure to other viral communicable diseases: Secondary | ICD-10-CM | POA: Diagnosis not present

## 2019-03-24 DIAGNOSIS — R1084 Generalized abdominal pain: Secondary | ICD-10-CM | POA: Diagnosis not present

## 2019-03-24 DIAGNOSIS — K6389 Other specified diseases of intestine: Secondary | ICD-10-CM | POA: Diagnosis not present

## 2019-03-24 DIAGNOSIS — K7689 Other specified diseases of liver: Secondary | ICD-10-CM | POA: Diagnosis not present

## 2019-03-24 DIAGNOSIS — K5720 Diverticulitis of large intestine with perforation and abscess without bleeding: Secondary | ICD-10-CM | POA: Diagnosis not present

## 2019-03-24 DIAGNOSIS — R109 Unspecified abdominal pain: Secondary | ICD-10-CM | POA: Diagnosis not present

## 2019-03-24 DIAGNOSIS — K631 Perforation of intestine (nontraumatic): Secondary | ICD-10-CM | POA: Diagnosis not present

## 2019-04-22 ENCOUNTER — Other Ambulatory Visit: Payer: Self-pay

## 2019-04-22 ENCOUNTER — Emergency Department (INDEPENDENT_AMBULATORY_CARE_PROVIDER_SITE_OTHER)
Admission: EM | Admit: 2019-04-22 | Discharge: 2019-04-22 | Disposition: A | Discharge status: Home / Self Care | Payer: Medicare Other | Source: Home / Self Care | Attending: Family Medicine | Admitting: Family Medicine

## 2019-04-22 ENCOUNTER — Encounter: Payer: Self-pay | Admitting: Emergency Medicine

## 2019-04-22 DIAGNOSIS — Z20822 Contact with and (suspected) exposure to covid-19: Secondary | ICD-10-CM | POA: Diagnosis not present

## 2019-04-22 NOTE — Discharge Instructions (Addendum)
Isolate yourself until COVID-19 test result is available.   If your COVID19 test is positive, then you are infected with the novel coronavirus and could give the virus to others.  Please continue isolation at home for at least 10 days since the start of your symptoms. If you do not have symptoms, please isolate at home for 10 days from the day you were tested. Once you complete your 10 day quarantine, you may return to normal activities as long as you've not had a fever for over 24 hours (without taking fever reducing medicine) and your symptoms are improving. Please continue good preventive care measures, including:  frequent hand-washing, avoid touching your face, cover coughs/sneezes, stay out of crowds and keep a 6 foot distance from others.  Go to the nearest hospital emergency room if fever/cough/breathlessness are severe or illness seems like a threat to life.   Increase Vitamin D3 to 5000 units daily, and Vitamin C 500mg  twice daily.

## 2019-04-22 NOTE — ED Provider Notes (Signed)
[name]    CSN: [id number] Arrival date & time: 04/22/19  0814      History   Chief Complaint Chief Complaint  Patient presents with  . covid test    HPI Jeremy Ruiz is a 74 y.o. male.   Patient presents for COVID19 testing.  He is completely assymptomatic at present.  The history is provided by the patient.    Past Medical History:  Diagnosis Date  . Borderline hypertension   . BPH (benign prostatic hyperplasia)   . Diverticulosis of sigmoid colon   . Elevated PSA   . H/O Bell's palsy    04/ 2012  right side  . Hx of adenomatous colonic polyps    05/ 2017  tubular adenoma  . Hypertension   . Nocturia   . Prostate cancer [facility])     Patient Active Problem List   Diagnosis Date Noted  . Prostate cancer ([place]) 06/04/2016  . Hx of adenomatous colonic polyps 09/12/2015    Past Surgical History:  Procedure Laterality Date  . COLONOSCOPY  last one 09-11-2015  . LYMPHADENECTOMY Bilateral 07/05/2016   Procedure: PELVIC LYMPHADENECTOMY;  Surgeon: Raynelle Bring, MD;  Location: [facility];  Service: Urology;  Laterality: Bilateral;  . PROSTATE BIOPSY  10/14/2011   Procedure: BIOPSY TRANSRECTAL ULTRASONIC PROSTATE (TUBP);  Surgeon: Ailene Rud, MD;  Location: [facility];  Service: Urology;  Laterality: N/A;      . PROSTATE BIOPSY N/A 05/07/2016   Procedure: SATURATED PROSTATE BIOPSY;  Surgeon: Carolan Clines, MD;  Location: [facility];  Service: Urology;  Laterality: N/A;  . ROBOT ASSISTED LAPAROSCOPIC RADICAL PROSTATECTOMY N/A 07/05/2016   Procedure: XI ROBOTIC ASSISTED LAPAROSCOPIC RADICAL PROSTATECTOMY LEVEL 2;  Surgeon: Raynelle Bring, MD;  Location: [facility];  Service: Urology;  Laterality: N/A;  . TRANSTHORACIC ECHOCARDIOGRAM  07-26-2010   NORMAL LVSF/ EF 06-65%   . TRANSURETHRAL RESECTION OF PROSTATE  2010       Home Medications    Prior to Admission medications   Medication Sig Start Date End Date  Taking? Authorizing Provider  ciprofloxacin (CIPRO) 500 MG tablet Take 500 mg by mouth continuous as needed. For travel 06/11/15   [provider]  hydrochlorothiazide (HYDRODIURIL) 25 MG tablet Take 25 mg by mouth daily.     [provider]  hyoscyamine (LEVSIN SL) 0.125 MG SL tablet Place 1 tablet (0.125 mg total) under the tongue every 6 (six) hours as needed. Take one every six hours as needed for abdominal cramping 06/09/18   Esterwood, Amy S, PA-C  Polyethyl Glycol-Propyl Glycol (LUBRICANT EYE DROPS, PF, OP) Place 1-2 drops into both eyes 3 (three) times daily as needed (for dry eyes).    [provider]  psyllium (METAMUCIL) 58.6 % packet Take 1 packet by mouth daily as needed (for regularity/constipation).    [provider]  sodium chloride (OCEAN) 0.65 % SOLN nasal spray Place 1 spray into both nostrils as needed for congestion.    [provider]    Family History Family History  Problem Relation Age of Onset  . Cancer Sister        lung smoker  . Cancer Paternal Uncle        colon/questionable  . Cancer Sister        lung smoker  . Cancer Other        brain  . Colon cancer Neg Hx   . Esophageal cancer Neg Hx   . Pancreatic cancer Neg  Hx   . Prostate cancer Neg Hx   . Rectal cancer Neg Hx   . Stomach cancer Neg Hx     Social History Social History   Tobacco Use  . Smoking status: Never Smoker  . Smokeless tobacco: Never Used  Substance Use Topics  . Alcohol use: No    Alcohol/week: 0.0 standard drinks  . Drug use: No     Allergies   Patient has no known allergies.   Review of Systems Review of Systems No sore throat No cough No pleuritic pain No wheezing No nasal congestion No post-nasal drainage No sinus pain/pressure No itchy/red eyes No earache No hemoptysis No SOB No fever/chills No nausea No vomiting No abdominal pain No diarrhea No urinary symptoms No skin rash No fatigue No myalgias No  headache   Physical Exam Triage Vital Signs ED Triage Vitals  Enc Vitals Group     BP 04/22/19 0850 (!) 154/96     Pulse Rate 04/22/19 0850 87     Resp 04/22/19 0850 16     Temp 04/22/19 0850 97.7 F (36.5 C)     Temp Source 04/22/19 0850 Oral     SpO2 04/22/19 0850 100 %     Weight 04/22/19 0851 165 lb 5.5 oz (75 kg)     Height 04/22/19 0851 5\' 8"  (1.727 m)     Head Circumference --      Peak Flow --      Pain Score 04/22/19 0851 0     Pain Loc --      Pain Edu? --      Excl. in [place]? --    No data found.  Updated Vital Signs BP (!) 154/96 (BP Location: Right Arm)   Pulse 87   Temp 97.7 F (36.5 C) (Oral)   Resp 16   Ht 5\' 8"  (1.727 m)   Wt 75 kg   SpO2 100%   BMI 25.14 kg/m   Visual Acuity Right Eye Distance:   Left Eye Distance:   Bilateral Distance:    Right Eye Near:   Left Eye Near:    Bilateral Near:     Physical Exam Vitals and nursing note reviewed.  Constitutional:      General: He is not in acute distress. Neurological:     Mental Status: He is alert.   Patient not examined otherwise   UC Treatments / Results  Labs (all labs ordered are listed, but only abnormal results are displayed) Labs Reviewed  NOVEL CORONAVIRUS, NAA    EKG   Radiology No results found.  Procedures Procedures (including critical care time)  Medications Ordered in UC Medications - No data to display  Initial Impression / Assessment and Plan / UC Course  I have reviewed the triage vital signs and the nursing notes.  Pertinent labs & imaging results that were available during my care of the patient were reviewed by me and considered in my medical decision making (see chart for details).    Patient assymptomatic at present  [place] send out   Final Clinical Impressions(s) / UC Diagnoses   Final diagnoses:  Exposure to COVID-19 virus     Discharge Instructions     Isolate yourself until COVID-19 test result is available.   If your COVID19 test is  positive, then you are infected with the novel coronavirus and could give the virus to others.  Please continue isolation at home for at least 10 days since the start of your symptoms.  If you do not have symptoms, please isolate at home for 10 days from the day you were tested. Once you complete your 10 day quarantine, you may return to normal activities as long as you've not had a fever for over 24 hours (without taking fever reducing medicine) and your symptoms are improving. Please continue good preventive care measures, including:  frequent hand-washing, avoid touching your face, cover coughs/sneezes, stay out of crowds and keep a 6 foot distance from others.  Go to the nearest hospital emergency room if fever/cough/breathlessness are severe or illness seems like a threat to life.   Increase Vitamin D3 to 5000 units daily, and Vitamin C 500mg  twice daily.    ED Prescriptions    None        Kandra Nicolas, MD 04/22/19 [phone]

## 2019-04-22 NOTE — ED Triage Notes (Signed)
Here for covid test; his granddaughter tested positive for covid yesterday and he saw her 7 days ago; he has no symptoms. He did have surgery recently but is doing well; have temporary colostomy. Has had influenza vacc this season.

## 2019-04-23 LAB — NOVEL CORONAVIRUS, NAA: SARS-CoV-2, NAA: NOT DETECTED

## 2019-04-25 DIAGNOSIS — Z9889 Other specified postprocedural states: Secondary | ICD-10-CM | POA: Diagnosis not present

## 2019-04-27 DIAGNOSIS — M542 Cervicalgia: Secondary | ICD-10-CM | POA: Diagnosis not present

## 2019-04-27 DIAGNOSIS — M79601 Pain in right arm: Secondary | ICD-10-CM | POA: Diagnosis not present

## 2019-04-27 DIAGNOSIS — M19011 Primary osteoarthritis, right shoulder: Secondary | ICD-10-CM | POA: Diagnosis not present

## 2019-04-27 DIAGNOSIS — M25511 Pain in right shoulder: Secondary | ICD-10-CM | POA: Diagnosis not present

## 2019-04-27 DIAGNOSIS — M25551 Pain in right hip: Secondary | ICD-10-CM | POA: Diagnosis not present

## 2019-04-27 DIAGNOSIS — M5031 Other cervical disc degeneration,  high cervical region: Secondary | ICD-10-CM | POA: Diagnosis not present

## 2019-04-27 DIAGNOSIS — M47812 Spondylosis without myelopathy or radiculopathy, cervical region: Secondary | ICD-10-CM | POA: Diagnosis not present

## 2019-05-01 DIAGNOSIS — Z433 Encounter for attention to colostomy: Secondary | ICD-10-CM | POA: Diagnosis not present

## 2019-05-08 DIAGNOSIS — Z9889 Other specified postprocedural states: Secondary | ICD-10-CM | POA: Diagnosis not present

## 2019-05-10 DIAGNOSIS — M5413 Radiculopathy, cervicothoracic region: Secondary | ICD-10-CM | POA: Diagnosis not present

## 2019-05-17 DIAGNOSIS — M542 Cervicalgia: Secondary | ICD-10-CM | POA: Diagnosis not present

## 2019-05-17 DIAGNOSIS — M5413 Radiculopathy, cervicothoracic region: Secondary | ICD-10-CM | POA: Diagnosis not present

## 2019-05-22 DIAGNOSIS — M5413 Radiculopathy, cervicothoracic region: Secondary | ICD-10-CM | POA: Diagnosis not present

## 2019-05-28 DIAGNOSIS — M5413 Radiculopathy, cervicothoracic region: Secondary | ICD-10-CM | POA: Diagnosis not present

## 2019-05-28 DIAGNOSIS — M4802 Spinal stenosis, cervical region: Secondary | ICD-10-CM | POA: Diagnosis not present

## 2019-06-05 DIAGNOSIS — K5792 Diverticulitis of intestine, part unspecified, without perforation or abscess without bleeding: Secondary | ICD-10-CM | POA: Diagnosis not present

## 2019-06-21 DIAGNOSIS — K5792 Diverticulitis of intestine, part unspecified, without perforation or abscess without bleeding: Secondary | ICD-10-CM | POA: Diagnosis not present

## 2019-06-26 DIAGNOSIS — M4802 Spinal stenosis, cervical region: Secondary | ICD-10-CM | POA: Diagnosis not present

## 2019-06-27 DIAGNOSIS — Z1211 Encounter for screening for malignant neoplasm of colon: Secondary | ICD-10-CM | POA: Diagnosis not present

## 2019-06-27 DIAGNOSIS — K5792 Diverticulitis of intestine, part unspecified, without perforation or abscess without bleeding: Secondary | ICD-10-CM | POA: Diagnosis not present

## 2019-06-27 DIAGNOSIS — D123 Benign neoplasm of transverse colon: Secondary | ICD-10-CM | POA: Diagnosis not present

## 2019-07-30 DIAGNOSIS — Z01812 Encounter for preprocedural laboratory examination: Secondary | ICD-10-CM | POA: Diagnosis not present

## 2019-07-30 DIAGNOSIS — Z933 Colostomy status: Secondary | ICD-10-CM | POA: Diagnosis not present

## 2019-07-30 DIAGNOSIS — Z20822 Contact with and (suspected) exposure to covid-19: Secondary | ICD-10-CM | POA: Diagnosis not present

## 2019-07-30 DIAGNOSIS — I10 Essential (primary) hypertension: Secondary | ICD-10-CM | POA: Diagnosis not present

## 2019-07-30 DIAGNOSIS — Z01810 Encounter for preprocedural cardiovascular examination: Secondary | ICD-10-CM | POA: Diagnosis not present

## 2019-07-30 DIAGNOSIS — R001 Bradycardia, unspecified: Secondary | ICD-10-CM | POA: Diagnosis not present

## 2019-08-02 DIAGNOSIS — K5792 Diverticulitis of intestine, part unspecified, without perforation or abscess without bleeding: Secondary | ICD-10-CM | POA: Diagnosis not present

## 2019-08-02 DIAGNOSIS — I10 Essential (primary) hypertension: Secondary | ICD-10-CM | POA: Diagnosis not present

## 2019-08-02 DIAGNOSIS — K5720 Diverticulitis of large intestine with perforation and abscess without bleeding: Secondary | ICD-10-CM | POA: Diagnosis not present

## 2019-08-02 DIAGNOSIS — Z933 Colostomy status: Secondary | ICD-10-CM | POA: Diagnosis not present

## 2019-08-02 DIAGNOSIS — Z433 Encounter for attention to colostomy: Secondary | ICD-10-CM | POA: Diagnosis not present

## 2019-08-02 DIAGNOSIS — Z79899 Other long term (current) drug therapy: Secondary | ICD-10-CM | POA: Diagnosis not present

## 2019-08-02 DIAGNOSIS — G8918 Other acute postprocedural pain: Secondary | ICD-10-CM | POA: Diagnosis not present

## 2019-08-02 DIAGNOSIS — K5730 Diverticulosis of large intestine without perforation or abscess without bleeding: Secondary | ICD-10-CM | POA: Diagnosis not present

## 2019-08-02 DIAGNOSIS — K660 Peritoneal adhesions (postprocedural) (postinfection): Secondary | ICD-10-CM | POA: Diagnosis not present

## 2019-08-02 DIAGNOSIS — G510 Bell's palsy: Secondary | ICD-10-CM | POA: Diagnosis not present

## 2019-08-02 DIAGNOSIS — Z8546 Personal history of malignant neoplasm of prostate: Secondary | ICD-10-CM | POA: Diagnosis not present

## 2019-08-03 DIAGNOSIS — K5730 Diverticulosis of large intestine without perforation or abscess without bleeding: Secondary | ICD-10-CM | POA: Diagnosis not present

## 2019-08-03 DIAGNOSIS — K5792 Diverticulitis of intestine, part unspecified, without perforation or abscess without bleeding: Secondary | ICD-10-CM | POA: Diagnosis not present

## 2019-08-03 DIAGNOSIS — K660 Peritoneal adhesions (postprocedural) (postinfection): Secondary | ICD-10-CM | POA: Diagnosis not present

## 2019-08-03 DIAGNOSIS — G510 Bell's palsy: Secondary | ICD-10-CM | POA: Diagnosis not present

## 2019-08-03 DIAGNOSIS — K5720 Diverticulitis of large intestine with perforation and abscess without bleeding: Secondary | ICD-10-CM | POA: Diagnosis not present

## 2019-08-03 DIAGNOSIS — I10 Essential (primary) hypertension: Secondary | ICD-10-CM | POA: Diagnosis not present

## 2019-08-03 DIAGNOSIS — Z8546 Personal history of malignant neoplasm of prostate: Secondary | ICD-10-CM | POA: Diagnosis not present

## 2019-08-03 DIAGNOSIS — Z79899 Other long term (current) drug therapy: Secondary | ICD-10-CM | POA: Diagnosis not present

## 2019-08-03 DIAGNOSIS — Z433 Encounter for attention to colostomy: Secondary | ICD-10-CM | POA: Diagnosis not present

## 2019-08-03 DIAGNOSIS — G8918 Other acute postprocedural pain: Secondary | ICD-10-CM | POA: Diagnosis not present

## 2019-08-03 DIAGNOSIS — Z933 Colostomy status: Secondary | ICD-10-CM | POA: Diagnosis not present

## 2019-08-13 DIAGNOSIS — Z9889 Other specified postprocedural states: Secondary | ICD-10-CM | POA: Diagnosis not present

## 2019-08-28 DIAGNOSIS — M5030 Other cervical disc degeneration, unspecified cervical region: Secondary | ICD-10-CM | POA: Diagnosis not present

## 2019-08-28 DIAGNOSIS — M542 Cervicalgia: Secondary | ICD-10-CM | POA: Diagnosis not present

## 2019-08-28 DIAGNOSIS — M6281 Muscle weakness (generalized): Secondary | ICD-10-CM | POA: Diagnosis not present

## 2019-08-30 DIAGNOSIS — M6281 Muscle weakness (generalized): Secondary | ICD-10-CM | POA: Diagnosis not present

## 2019-08-30 DIAGNOSIS — M5030 Other cervical disc degeneration, unspecified cervical region: Secondary | ICD-10-CM | POA: Diagnosis not present

## 2019-08-30 DIAGNOSIS — M542 Cervicalgia: Secondary | ICD-10-CM | POA: Diagnosis not present

## 2019-09-04 DIAGNOSIS — M6281 Muscle weakness (generalized): Secondary | ICD-10-CM | POA: Diagnosis not present

## 2019-09-04 DIAGNOSIS — M542 Cervicalgia: Secondary | ICD-10-CM | POA: Diagnosis not present

## 2019-09-04 DIAGNOSIS — M5030 Other cervical disc degeneration, unspecified cervical region: Secondary | ICD-10-CM | POA: Diagnosis not present

## 2019-09-06 DIAGNOSIS — M542 Cervicalgia: Secondary | ICD-10-CM | POA: Diagnosis not present

## 2019-09-06 DIAGNOSIS — Z23 Encounter for immunization: Secondary | ICD-10-CM | POA: Diagnosis not present

## 2019-09-06 DIAGNOSIS — M6281 Muscle weakness (generalized): Secondary | ICD-10-CM | POA: Diagnosis not present

## 2019-09-06 DIAGNOSIS — M5030 Other cervical disc degeneration, unspecified cervical region: Secondary | ICD-10-CM | POA: Diagnosis not present

## 2019-09-11 DIAGNOSIS — M5030 Other cervical disc degeneration, unspecified cervical region: Secondary | ICD-10-CM | POA: Diagnosis not present

## 2019-09-11 DIAGNOSIS — M542 Cervicalgia: Secondary | ICD-10-CM | POA: Diagnosis not present

## 2019-09-11 DIAGNOSIS — M6281 Muscle weakness (generalized): Secondary | ICD-10-CM | POA: Diagnosis not present

## 2019-09-14 DIAGNOSIS — M6281 Muscle weakness (generalized): Secondary | ICD-10-CM | POA: Diagnosis not present

## 2019-09-14 DIAGNOSIS — M5030 Other cervical disc degeneration, unspecified cervical region: Secondary | ICD-10-CM | POA: Diagnosis not present

## 2019-09-14 DIAGNOSIS — M542 Cervicalgia: Secondary | ICD-10-CM | POA: Diagnosis not present

## 2019-09-15 ENCOUNTER — Emergency Department (INDEPENDENT_AMBULATORY_CARE_PROVIDER_SITE_OTHER)
Admission: EM | Admit: 2019-09-15 | Discharge: 2019-09-15 | Disposition: A | Discharge status: Home / Self Care | Payer: Medicare Other | Source: Home / Self Care

## 2019-09-15 ENCOUNTER — Encounter: Payer: Self-pay | Admitting: Emergency Medicine

## 2019-09-15 ENCOUNTER — Other Ambulatory Visit: Payer: Self-pay

## 2019-09-15 DIAGNOSIS — H6123 Impacted cerumen, bilateral: Secondary | ICD-10-CM | POA: Diagnosis not present

## 2019-09-15 NOTE — ED Triage Notes (Signed)
Pt has had loss of hearing in R ear - history of ear wax build up - normally on  Left Large clump of wax noted to Right ear canal  No complaints on left Covid Vaccine - Pfizer 2nd one due June 10th

## 2019-09-15 NOTE — ED Provider Notes (Signed)
[name]    CSN: [id number] Arrival date & time: 09/15/19  1238      History   Chief Complaint Chief Complaint  Patient presents with  . Otalgia    right    HPI Jeremy Ruiz is a 74 y.o. male.   HPI Jeremy Ruiz is a 74 y.o. male presenting to UC with c/o bilateral ear fullness, worse in Right ear since this morning after taking a warm shower. He feels like he has a large ball of wax in his Right ear.  Hx of needing ears flushed in the past.  Last time was about 1 year ago. Denies ear pain. No HA, dizziness or URI symptoms.    Past Medical History:  Diagnosis Date  . Borderline hypertension   . BPH (benign prostatic hyperplasia)   . Diverticulosis of sigmoid colon   . Elevated PSA   . H/O Bell's palsy    04/ 2012  right side  . Hx of adenomatous colonic polyps    05/ 2017  tubular adenoma  . Hypertension   . Nocturia   . Prostate cancer [facility])     Patient Active Problem List   Diagnosis Date Noted  . Prostate cancer ([place]) 06/04/2016  . Hx of adenomatous colonic polyps 09/12/2015    Past Surgical History:  Procedure Laterality Date  . COLONOSCOPY  last one 09-11-2015  . LYMPHADENECTOMY Bilateral 07/05/2016   Procedure: PELVIC LYMPHADENECTOMY;  Surgeon: Raynelle Bring, MD;  [facility];  Service: Urology;  Laterality: Bilateral;  . PROSTATE BIOPSY  10/14/2011   Procedure: BIOPSY TRANSRECTAL ULTRASONIC PROSTATE (TUBP);  Surgeon: Ailene Rud, MD;  [facility];  Service: Urology;  Laterality: N/A;      . PROSTATE BIOPSY N/A 05/07/2016   Procedure: SATURATED PROSTATE BIOPSY;  Surgeon: Carolan Clines, MD;  Location[facility];  Service: Urology;  Laterality: N/A;  . ROBOT ASSISTED LAPAROSCOPIC RADICAL PROSTATECTOMY N/A 07/05/2016   Procedure: XI ROBOTIC ASSISTED LAPAROSCOPIC RADICAL PROSTATECTOMY LEVEL 2;  Surgeon: Raynelle Bring, MD;  [facility];  Service: Urology;  Laterality: N/A;  .  TRANSTHORACIC ECHOCARDIOGRAM  07-26-2010   NORMAL LVSF/ EF 06-65%   . TRANSURETHRAL RESECTION OF PROSTATE  2010       Home Medications    Prior to Admission medications   Medication Sig Start Date End Date Taking? Authorizing Provider  cetirizine (ZYRTEC) 10 MG tablet Take by mouth.   Yes [provider]  Cholecalciferol 25 MCG (1000 UT) tablet Take by mouth.   Yes [provider]  hydrochlorothiazide (HYDRODIURIL) 25 MG tablet Take 25 mg by mouth daily.    Yes [provider]  Multiple Vitamins-Minerals (PRESERVISION AREDS 2) CAPS Take by mouth.   Yes [provider]  Polyethyl Glycol-Propyl Glycol (LUBRICANT EYE DROPS, PF, OP) Place 1-2 drops into both eyes 3 (three) times daily as needed (for dry eyes).   Yes [provider]  psyllium (METAMUCIL) 58.6 % packet Take 1 packet by mouth daily as needed (for regularity/constipation).   Yes [provider]  ciprofloxacin (CIPRO) 500 MG tablet Take 500 mg by mouth continuous as needed. For travel 06/11/15   [provider]  hyoscyamine (LEVSIN SL) 0.125 MG SL tablet Place 1 tablet (0.125 mg total) under the tongue every 6 (six) hours as needed. Take one every six hours as needed for abdominal cramping 06/09/18   Esterwood, Amy S, PA-C  sodium chloride (OCEAN) 0.65 % SOLN nasal spray Place  1 spray into both nostrils as needed for congestion.    [provider]    Family History Family History  Problem Relation Age of Onset  . Cancer Sister        lung smoker  . Cancer Paternal Uncle        colon/questionable  . Cancer Sister        lung smoker  . Cancer Other        brain  . Colon cancer Neg Hx   . Esophageal cancer Neg Hx   . Pancreatic cancer Neg Hx   . Prostate cancer Neg Hx   . Rectal cancer Neg Hx   . Stomach cancer Neg Hx     Social History Social History   Tobacco Use  . Smoking status: Never Smoker  . Smokeless tobacco: Never Used  Substance Use  Topics  . Alcohol use: No    Alcohol/week: 0.0 standard drinks  . Drug use: No     Allergies   Patient has no known allergies.   Review of Systems Review of Systems  HENT: Positive for hearing loss ( ear fullness). Negative for congestion, ear discharge and ear pain.   Neurological: Negative for dizziness and headaches.     Physical Exam Triage Vital Signs ED Triage Vitals [09/15/19 1259]  Enc Vitals Group     BP (!) 150/97     Pulse Rate 77     Resp 16     Temp 98.6 F (37 C)     Temp Source Oral     SpO2 97 %     Weight      Height      Head Circumference      Peak Flow      Pain Score      Pain Loc      Pain Edu?      Excl. in [place]?    No data found.  Updated Vital Signs BP (!) 150/97 (BP Location: Right Arm)   Pulse 77   Temp 98.6 F (37 C) (Oral)   Resp 16   SpO2 97%   Visual Acuity Right Eye Distance:   Left Eye Distance:   Bilateral Distance:    Right Eye Near:   Left Eye Near:    Bilateral Near:     Physical Exam Vitals and nursing note reviewed.  Constitutional:      Appearance: Normal appearance. He is well-developed.  HENT:     Head: Normocephalic and atraumatic.     Right Ear: There is impacted cerumen. Tympanic membrane is not erythematous or bulging.     Left Ear: There is impacted cerumen. Tympanic membrane is not erythematous or bulging.     Nose: Nose normal.  Cardiovascular:     Rate and Rhythm: Normal rate.  Pulmonary:     Effort: Pulmonary effort is normal.  Musculoskeletal:        General: Normal range of motion.     Cervical back: Normal range of motion.  Skin:    General: Skin is warm and dry.  Neurological:     Mental Status: He is alert and oriented to person, place, and time.  Psychiatric:        Behavior: Behavior normal.      UC Treatments / Results  Labs (all labs ordered are listed, but only abnormal results are displayed) Labs Reviewed - No data to display  EKG   Radiology No results  found.  Procedures Procedures (including critical  care time)  Medications Ordered in UC Medications - No data to display  Initial Impression / Assessment and Plan / UC Course  I have reviewed the triage vital signs and the nursing notes.  Pertinent labs & imaging results that were available during my care of the patient were reviewed by me and considered in my medical decision making (see chart for details).     Cerumen removed using flushing technique by Johnnye Sima, RN w/o complication No evidence of bacterial infection AVS provided  Final Clinical Impressions(s) / UC Diagnoses   Final diagnoses:  Hearing loss due to cerumen impaction, bilateral   Discharge Instructions   None    ED Prescriptions    None     PDMP not reviewed this encounter.   Noe Gens, [place] 09/15/19 1334

## 2019-09-27 DIAGNOSIS — Z23 Encounter for immunization: Secondary | ICD-10-CM | POA: Diagnosis not present

## 2019-10-01 DIAGNOSIS — K432 Incisional hernia without obstruction or gangrene: Secondary | ICD-10-CM | POA: Diagnosis not present

## 2019-10-23 DIAGNOSIS — G510 Bell's palsy: Secondary | ICD-10-CM | POA: Diagnosis not present

## 2019-10-23 DIAGNOSIS — E785 Hyperlipidemia, unspecified: Secondary | ICD-10-CM | POA: Diagnosis not present

## 2019-10-23 DIAGNOSIS — K432 Incisional hernia without obstruction or gangrene: Secondary | ICD-10-CM | POA: Diagnosis not present

## 2019-10-23 DIAGNOSIS — M5412 Radiculopathy, cervical region: Secondary | ICD-10-CM | POA: Diagnosis not present

## 2019-10-23 DIAGNOSIS — Z8546 Personal history of malignant neoplasm of prostate: Secondary | ICD-10-CM | POA: Diagnosis not present

## 2019-10-23 DIAGNOSIS — Z9049 Acquired absence of other specified parts of digestive tract: Secondary | ICD-10-CM | POA: Diagnosis not present

## 2019-10-23 DIAGNOSIS — Z8719 Personal history of other diseases of the digestive system: Secondary | ICD-10-CM | POA: Diagnosis not present

## 2019-11-06 DIAGNOSIS — Z9889 Other specified postprocedural states: Secondary | ICD-10-CM | POA: Diagnosis not present

## 2019-11-13 DIAGNOSIS — M4802 Spinal stenosis, cervical region: Secondary | ICD-10-CM | POA: Diagnosis not present

## 2019-11-13 DIAGNOSIS — M5413 Radiculopathy, cervicothoracic region: Secondary | ICD-10-CM | POA: Diagnosis not present

## 2020-02-25 DIAGNOSIS — Z0000 Encounter for general adult medical examination without abnormal findings: Secondary | ICD-10-CM | POA: Diagnosis not present

## 2020-07-14 DIAGNOSIS — R0789 Other chest pain: Secondary | ICD-10-CM | POA: Diagnosis not present

## 2020-07-14 DIAGNOSIS — R931 Abnormal findings on diagnostic imaging of heart and coronary circulation: Secondary | ICD-10-CM | POA: Diagnosis not present

## 2020-07-14 DIAGNOSIS — I10 Essential (primary) hypertension: Secondary | ICD-10-CM | POA: Diagnosis not present

## 2020-07-14 DIAGNOSIS — E785 Hyperlipidemia, unspecified: Secondary | ICD-10-CM | POA: Diagnosis not present

## 2020-08-01 ENCOUNTER — Other Ambulatory Visit: Payer: Self-pay

## 2020-08-01 ENCOUNTER — Emergency Department (INDEPENDENT_AMBULATORY_CARE_PROVIDER_SITE_OTHER)
Admission: EM | Admit: 2020-08-01 | Discharge: 2020-08-01 | Disposition: A | Discharge status: Home / Self Care | Payer: Medicare Other | Source: Home / Self Care | Attending: Emergency Medicine | Admitting: Emergency Medicine

## 2020-08-01 DIAGNOSIS — H6123 Impacted cerumen, bilateral: Secondary | ICD-10-CM | POA: Diagnosis not present

## 2020-08-01 DIAGNOSIS — H6121 Impacted cerumen, right ear: Secondary | ICD-10-CM | POA: Diagnosis not present

## 2020-08-01 NOTE — ED Provider Notes (Signed)
HPI  SUBJECTIVE:  Jeremy Ruiz is a 75 y.o. male who presents with decreased hearing in his right ear, sensation of ear fullness and that his ear is "stopped up" starting this morning.  No pain, fevers, otorrhea.  He tried earwax removal kit with worsening of his symptoms.  No alleviating factors.  States that he usually needs his ears irrigated out annually.  He has a past medical history of hypertension, prostate cancer status post prostatectomy.  DGL:OVFIEP, Bryson Ha, MD     Past Medical History:  Diagnosis Date  . Borderline hypertension   . BPH (benign prostatic hyperplasia)   . Diverticulosis of sigmoid colon   . Elevated PSA   . H/O Bell's palsy    04/ 2012  right side  . Hx of adenomatous colonic polyps    05/ 2017  tubular adenoma  . Hypertension   . Nocturia   . Prostate cancer [facility])     Past Surgical History:  Procedure Laterality Date  . COLONOSCOPY  last one 09-11-2015  . LYMPHADENECTOMY Bilateral 07/05/2016   Procedure: PELVIC LYMPHADENECTOMY;  Surgeon: Raynelle Bring, MD;  Location: [facility];  Service: Urology;  Laterality: Bilateral;  . PROSTATE BIOPSY  10/14/2011   Procedure: BIOPSY TRANSRECTAL ULTRASONIC PROSTATE (TUBP);  Surgeon: Ailene Rud, MD;  Location: [facility];  Service: Urology;  Laterality: N/A;      . PROSTATE BIOPSY N/A 05/07/2016   Procedure: SATURATED PROSTATE BIOPSY;  Surgeon: Carolan Clines, MD;  Location[facility];  Service: Urology;  Laterality: N/A;  . ROBOT ASSISTED LAPAROSCOPIC RADICAL PROSTATECTOMY N/A 07/05/2016   Procedure: XI ROBOTIC ASSISTED LAPAROSCOPIC RADICAL PROSTATECTOMY LEVEL 2;  Surgeon: Raynelle Bring, MD;  Location: [facility];  Service: Urology;  Laterality: N/A;  . TRANSTHORACIC ECHOCARDIOGRAM  07-26-2010   NORMAL LVSF/ EF 06-65%   . TRANSURETHRAL RESECTION OF PROSTATE  2010    Family History  Problem Relation Age of Onset  . Stroke Mother   . Lung disease Father   . Cancer Sister         lung smoker  . Cancer Paternal Uncle        colon/questionable  . Cancer Sister        lung smoker  . Cancer Other        brain  . Colon cancer Neg Hx   . Esophageal cancer Neg Hx   . Pancreatic cancer Neg Hx   . Prostate cancer Neg Hx   . Rectal cancer Neg Hx   . Stomach cancer Neg Hx     Social History   Tobacco Use  . Smoking status: Never Smoker  . Smokeless tobacco: Never Used  Vaping Use  . Vaping Use: Never used  Substance Use Topics  . Alcohol use: No    Alcohol/week: 0.0 standard drinks  . Drug use: No    No current facility-administered medications for this encounter.  Current Outpatient Medications:  .  cetirizine (ZYRTEC) 10 MG tablet, Take by mouth., Disp: , Rfl:  .  Cholecalciferol 25 MCG (1000 UT) tablet, Take by mouth., Disp: , Rfl:  .  hydrochlorothiazide (HYDRODIURIL) 25 MG tablet, Take 25 mg by mouth daily. , Disp: , Rfl:  .  hyoscyamine (LEVSIN SL) 0.125 MG SL tablet, Place 1 tablet (0.125 mg total) under the tongue every 6 (six) hours as needed. Take one every six hours as needed for abdominal cramping, Disp: 40 tablet, Rfl: 2 .  Multiple Vitamins-Minerals (PRESERVISION AREDS 2) CAPS, Take by  mouth., Disp: , Rfl:  .  Polyethyl Glycol-Propyl Glycol (LUBRICANT EYE DROPS, PF, OP), Place 1-2 drops into both eyes 3 (three) times daily as needed (for dry eyes)., Disp: , Rfl:  .  psyllium (METAMUCIL) 58.6 % packet, Take 1 packet by mouth daily as needed (for regularity/constipation)., Disp: , Rfl:  .  sodium chloride (OCEAN) 0.65 % SOLN nasal spray, Place 1 spray into both nostrils as needed for congestion., Disp: , Rfl:   No Known Allergies   ROS  As noted in HPI.   Physical Exam  BP (!) 149/90 (BP Location: Right Arm)   Pulse 72   Temp 97.6 F (36.4 C)   Resp 18   Ht _0  (1.727 m)   Wt 76.7 kg   SpO2 99%   BMI 25.70 kg/m   Constitutional: Well developed, well nourished, no acute distress Eyes:  EOMI, conjunctiva normal  bilaterally HENT: Normocephalic, atraumatic,mucus membranes moist.  Cerumen impaction right ear.  Hearing grossly decreased compared to left ear.  Extensive cerumen in left ear, but able to see small amount of tympanic membrane. Respiratory: Normal inspiratory effort Cardiovascular: Normal rate GI: nondistended skin: No rash, skin intact Musculoskeletal: no deformities Neurologic: Alert & oriented x 3, no focal neuro deficits Psychiatric: Speech and behavior appropriate   ED Course   Medications - No data to display  Orders Placed This Encounter  Procedures  . Ear wax removal    Both ears    Standing Status:   Standing    Number of Occurrences:   1    No results found for this or any previous visit (from the past 24 hour(s)). No results found.  ED Clinical Impression  1. Hearing loss of right ear due to cerumen impaction   2. Bilateral impacted cerumen      ED Assessment/Plan  Patient with right ear cerumen impaction and a lot of wax in his left ear.  Will have both irrigated.  Advised Debrox to prevent buildup.  Follow-up with PMD as needed.  Post irrigation, TMs intact, normal bilaterally.  Hearing improved.  Right ear canal completely clear, small amount of cerumen left in left ear canal.  Plan as above.  No orders of the defined types were placed in this encounter.     *This clinic note was created using Dragon dictation software. Therefore, there may be occasional mistakes despite careful proofreading.  ?    Melynda Ripple, MD 08/01/20 [phone]

## 2020-08-01 NOTE — Discharge Instructions (Addendum)
Try Debrox once or twice a week to help prevent buildup in the future.  Do not use Q-tips.  You can take a very thin cloth and use your little finger to clean your ear.

## 2020-08-01 NOTE — ED Triage Notes (Signed)
Pt presents to Urgent Care with c/o R ear being clogged w/ wax. Reports trying to flush ear w/ OTC kit w/o success.

## 2020-08-21 DIAGNOSIS — E785 Hyperlipidemia, unspecified: Secondary | ICD-10-CM | POA: Diagnosis not present

## 2020-08-21 DIAGNOSIS — Z79899 Other long term (current) drug therapy: Secondary | ICD-10-CM | POA: Diagnosis not present

## 2020-09-01 DIAGNOSIS — R931 Abnormal findings on diagnostic imaging of heart and coronary circulation: Secondary | ICD-10-CM | POA: Diagnosis not present

## 2020-09-01 DIAGNOSIS — R0789 Other chest pain: Secondary | ICD-10-CM | POA: Diagnosis not present

## 2020-09-01 DIAGNOSIS — I10 Essential (primary) hypertension: Secondary | ICD-10-CM | POA: Diagnosis not present

## 2020-09-01 DIAGNOSIS — Z8249 Family history of ischemic heart disease and other diseases of the circulatory system: Secondary | ICD-10-CM | POA: Diagnosis not present

## 2020-09-01 DIAGNOSIS — E785 Hyperlipidemia, unspecified: Secondary | ICD-10-CM | POA: Diagnosis not present

## 2020-09-12 DIAGNOSIS — I10 Essential (primary) hypertension: Secondary | ICD-10-CM | POA: Diagnosis not present

## 2020-09-12 DIAGNOSIS — E785 Hyperlipidemia, unspecified: Secondary | ICD-10-CM | POA: Diagnosis not present

## 2020-09-12 DIAGNOSIS — R931 Abnormal findings on diagnostic imaging of heart and coronary circulation: Secondary | ICD-10-CM | POA: Diagnosis not present

## 2020-10-02 DIAGNOSIS — H6123 Impacted cerumen, bilateral: Secondary | ICD-10-CM | POA: Diagnosis not present

## 2020-11-14 DIAGNOSIS — L820 Inflamed seborrheic keratosis: Secondary | ICD-10-CM | POA: Diagnosis not present

## 2020-11-14 DIAGNOSIS — Z1283 Encounter for screening for malignant neoplasm of skin: Secondary | ICD-10-CM | POA: Diagnosis not present

## 2020-11-14 DIAGNOSIS — D225 Melanocytic nevi of trunk: Secondary | ICD-10-CM | POA: Diagnosis not present

## 2020-11-14 DIAGNOSIS — B351 Tinea unguium: Secondary | ICD-10-CM | POA: Diagnosis not present

## 2020-11-14 DIAGNOSIS — D485 Neoplasm of uncertain behavior of skin: Secondary | ICD-10-CM | POA: Diagnosis not present

## 2020-12-02 DIAGNOSIS — D487 Neoplasm of uncertain behavior of other specified sites: Secondary | ICD-10-CM | POA: Diagnosis not present

## 2020-12-02 DIAGNOSIS — D485 Neoplasm of uncertain behavior of skin: Secondary | ICD-10-CM | POA: Diagnosis not present

## 2020-12-11 DIAGNOSIS — L820 Inflamed seborrheic keratosis: Secondary | ICD-10-CM | POA: Diagnosis not present

## 2020-12-11 DIAGNOSIS — L821 Other seborrheic keratosis: Secondary | ICD-10-CM | POA: Diagnosis not present

## 2021-01-26 DIAGNOSIS — H25813 Combined forms of age-related cataract, bilateral: Secondary | ICD-10-CM | POA: Diagnosis not present

## 2021-01-26 DIAGNOSIS — H353132 Nonexudative age-related macular degeneration, bilateral, intermediate dry stage: Secondary | ICD-10-CM | POA: Diagnosis not present

## 2021-01-26 DIAGNOSIS — H04123 Dry eye syndrome of bilateral lacrimal glands: Secondary | ICD-10-CM | POA: Diagnosis not present

## 2021-01-26 DIAGNOSIS — H35373 Puckering of macula, bilateral: Secondary | ICD-10-CM | POA: Diagnosis not present

## 2021-03-31 DIAGNOSIS — D485 Neoplasm of uncertain behavior of skin: Secondary | ICD-10-CM | POA: Diagnosis not present

## 2021-03-31 DIAGNOSIS — L820 Inflamed seborrheic keratosis: Secondary | ICD-10-CM | POA: Diagnosis not present

## 2021-04-02 DIAGNOSIS — G510 Bell's palsy: Secondary | ICD-10-CM | POA: Diagnosis not present

## 2021-04-02 DIAGNOSIS — H25811 Combined forms of age-related cataract, right eye: Secondary | ICD-10-CM | POA: Diagnosis not present

## 2021-04-02 DIAGNOSIS — I10 Essential (primary) hypertension: Secondary | ICD-10-CM | POA: Diagnosis not present

## 2021-04-02 DIAGNOSIS — M1990 Unspecified osteoarthritis, unspecified site: Secondary | ICD-10-CM | POA: Diagnosis not present

## 2021-04-02 DIAGNOSIS — Z79899 Other long term (current) drug therapy: Secondary | ICD-10-CM | POA: Diagnosis not present

## 2021-04-29 ENCOUNTER — Emergency Department (INDEPENDENT_AMBULATORY_CARE_PROVIDER_SITE_OTHER)
Admission: EM | Admit: 2021-04-29 | Discharge: 2021-04-29 | Disposition: A | Discharge status: Home / Self Care | Payer: Medicare Other | Source: Home / Self Care | Attending: Family Medicine | Admitting: Family Medicine

## 2021-04-29 ENCOUNTER — Other Ambulatory Visit: Payer: Self-pay

## 2021-04-29 DIAGNOSIS — M79671 Pain in right foot: Secondary | ICD-10-CM

## 2021-04-29 DIAGNOSIS — T23261A Burn of second degree of back of right hand, initial encounter: Secondary | ICD-10-CM | POA: Diagnosis not present

## 2021-04-29 DIAGNOSIS — T23061A Burn of unspecified degree of back of right hand, initial encounter: Secondary | ICD-10-CM

## 2021-04-29 DIAGNOSIS — Z23 Encounter for immunization: Secondary | ICD-10-CM

## 2021-04-29 MED ORDER — MUPIROCIN 2 % EX OINT: 1.0000 "application " | TOPICAL_OINTMENT | Freq: Two times a day (BID) | CUTANEOUS | 0 refills | Status: DC

## 2021-04-29 MED ORDER — TETANUS-DIPHTH-ACELL PERTUSSIS 5-2.5-18.5 LF-MCG/0.5 IM SUSY
0.5000 mL | PREFILLED_SYRINGE | Freq: Once | INTRAMUSCULAR | Status: AC
  Administered 2021-04-29: 0.5 mL via INTRAMUSCULAR

## 2021-04-29 NOTE — Discharge Instructions (Addendum)
Use mupirocin ointment on the burn.  Rub in twice a day The nodule on your foot appears to be bruised.  I predict will resolve over time.  If it is painful, take ibuprofen 600 mg 3 times a day with food Follow-up with your primary care doctor

## 2021-04-29 NOTE — ED Triage Notes (Signed)
Pt states that he has a burn to his right hand. X1 week  Pt states that the skin is peeling.

## 2021-04-29 NOTE — ED Triage Notes (Signed)
Pt states that he notices a cyst on the inner portion of his right foot. X2 day

## 2021-04-29 NOTE — ED Provider Notes (Signed)
[facility]    CSN: [id number] Arrival date & time: 04/29/21  9417      History   Chief Complaint Chief Complaint  Patient presents with   Foot Problem    Right foot problem x2 days   Hand Burn    Right hand burn x1 week    HPI Jeremy Ruiz is a 76 y.o. male.   HPI   Patient is here for 2 problems.  First he has a burn on his right hand.  It is from steam and hot water when he was canning.  He wants to have it checked to make sure it is healing okay  He also has a nodule on the bottom of his right foot.  He noticed it 2 days ago.  It is tender when he puts weight on the foot and walks.  He has had no injury.  No change in shoes. Past Medical History:  Diagnosis Date   Borderline hypertension    BPH (benign prostatic hyperplasia)    Diverticulosis of sigmoid colon    Elevated PSA    H/O Bell's palsy    04/ 2012  right side   Hx of adenomatous colonic polyps    05/ 2017  tubular adenoma   Hypertension    Nocturia    Prostate cancer [facility])     Patient Active Problem List   Diagnosis Date Noted   Prostate cancer ([place]) 06/04/2016   Hx of adenomatous colonic polyps 09/12/2015    Past Surgical History:  Procedure Laterality Date   CATARACT EXTRACTION     COLONOSCOPY  last one 09-11-2015   LYMPHADENECTOMY Bilateral 07/05/2016   Procedure: PELVIC LYMPHADENECTOMY;  Surgeon: Raynelle Bring, MD;  [facility];  Service: Urology;  Laterality: Bilateral;   PROSTATE BIOPSY  10/14/2011   Procedure: BIOPSY TRANSRECTAL ULTRASONIC PROSTATE (TUBP);  Surgeon: Ailene Rud, MD;  Location: [facility];  Service: Urology;  Laterality: N/A;       PROSTATE BIOPSY N/A 05/07/2016   Procedure: SATURATED PROSTATE BIOPSY;  Surgeon: Carolan Clines, MD;  Location: [facility];  Service: Urology;  Laterality: N/A;   ROBOT ASSISTED LAPAROSCOPIC RADICAL PROSTATECTOMY N/A 07/05/2016   Procedure: XI ROBOTIC ASSISTED LAPAROSCOPIC  RADICAL PROSTATECTOMY LEVEL 2;  Surgeon: Raynelle Bring, MD;  [facility];  Service: Urology;  Laterality: N/A;   TRANSTHORACIC ECHOCARDIOGRAM  07/26/2010   NORMAL LVSF/ EF 06-65%    TRANSURETHRAL RESECTION OF PROSTATE  2010       Home Medications    Prior to Admission medications   Medication Sig Start Date End Date Taking? Authorizing Provider  cetirizine (ZYRTEC) 10 MG tablet Take by mouth.   Yes [provider]  Cholecalciferol 25 MCG (1000 UT) tablet Take by mouth.   Yes [provider]  cyanocobalamin 1000 MCG tablet Take 1 tablet by mouth daily.   Yes [provider]  hydrochlorothiazide (HYDRODIURIL) 25 MG tablet Take 25 mg by mouth daily.    Yes [provider]  Multiple Vitamins-Minerals (PRESERVISION AREDS 2) CAPS Take by mouth.   Yes [provider]  mupirocin ointment (BACTROBAN) 2 % Apply 1 application topically 2 (two) times daily. 04/29/21  Yes Raylene Everts, MD  Polyethyl Glycol-Propyl Glycol (LUBRICANT EYE DROPS, PF, OP) Place 1-2 drops into both eyes 3 (three) times daily as needed (for dry eyes).   Yes [provider]  psyllium (METAMUCIL) 58.6 % packet Take 1 packet by mouth daily as needed (for  regularity/constipation).   Yes [provider]  sodium chloride (OCEAN) 0.65 % SOLN nasal spray Place 1 spray into both nostrils as needed for congestion.   Yes [provider]    Family History Family History  Problem Relation Age of Onset   Stroke Mother    Lung disease Father    Cancer Sister        lung smoker   Cancer Paternal Uncle        colon/questionable   Cancer Sister        lung smoker   Cancer Other        brain   Colon cancer Neg Hx    Esophageal cancer Neg Hx    Pancreatic cancer Neg Hx    Prostate cancer Neg Hx    Rectal cancer Neg Hx    Stomach cancer Neg Hx     Social History Social History   Tobacco Use   Smoking status: Never   Smokeless tobacco: Never   Vaping Use   Vaping Use: Never used  Substance Use Topics   Alcohol use: No    Alcohol/week: 0.0 standard drinks   Drug use: No     Allergies   Patient has no known allergies.   Review of Systems Review of Systems  See HPI Physical Exam Triage Vital Signs ED Triage Vitals  Enc Vitals Group     BP 04/29/21 1107 (!) 161/90     Pulse Rate 04/29/21 1107 61     Resp 04/29/21 1107 18     Temp 04/29/21 1107 97.8 F (36.6 C)     Temp Source 04/29/21 1107 Oral     SpO2 04/29/21 1107 100 %     Weight 04/29/21 1104 169 lb (76.7 kg)     Height 04/29/21 1104 5\' 8"  (1.727 m)     Head Circumference --      Peak Flow --      Pain Score 04/29/21 1103 2     Pain Loc --      Pain Edu? --      Excl. in [place]? --    No data found.  Updated Vital Signs BP (!) 161/90 (BP Location: Right Arm)    Pulse 61    Temp 97.8 F (36.6 C) (Oral)    Resp 18    Ht 5\' 8"  (1.727 m)    Wt 76.7 kg    SpO2 100%    BMI 25.70 kg/m  :     Physical Exam Constitutional:      General: He is not in acute distress.    Appearance: He is well-developed and normal weight.  HENT:     Head: Normocephalic and atraumatic.     Mouth/Throat:     Comments: Mask is in place Eyes:     Conjunctiva/sclera: Conjunctivae normal.     Pupils: Pupils are equal, round, and reactive to light.  Cardiovascular:     Rate and Rhythm: Normal rate.  Pulmonary:     Effort: Pulmonary effort is normal. No respiratory distress.  Abdominal:     General: There is no distension.     Palpations: Abdomen is soft.  Musculoskeletal:        General: Normal range of motion.     Cervical back: Normal range of motion.       Feet:  Skin:    General: Skin is warm and dry.     Comments: Right hand has a partial-thickness burn on the dorsum towards  the webspace, around to the wrist.  It is all partial-thickness.  No erythema or tenderness to suggest infection  Neurological:     Mental Status: He is alert.  Psychiatric:        Mood and  Affect: Mood normal.        Behavior: Behavior normal.     UC Treatments / Results  Labs (all labs ordered are listed, but only abnormal results are displayed) Labs Reviewed - No data to display  EKG   Radiology No results found.  Procedures Procedures (including critical care time)  Medications Ordered in UC Medications  Tdap (BOOSTRIX) injection 0.5 mL (0.5 mLs Intramuscular Given 04/29/21 1148)    Initial Impression / Assessment and Plan / UC Course  I have reviewed the triage vital signs and the nursing notes.  Pertinent labs & imaging results that were available during my care of the patient were reviewed by me and considered in my medical decision making (see chart for details).     Plantar nodule is tender.  I suspect that his longstanding and benign. Burn appears to be healing well.  Wound care discussed Tetanus is updated Final Clinical Impressions(s) / UC Diagnoses   Final diagnoses:  Burn of back of right hand, unspecified burn degree, initial encounter     Discharge Instructions      Use mupirocin ointment on the burn.  Rub in twice a day The nodule on your foot appears to be bruised.  I predict will resolve over time.  If it is painful, take ibuprofen 600 mg 3 times a day with food Follow-up with your primary care doctor   ED Prescriptions     Medication Sig Dispense Auth. Provider   mupirocin ointment (BACTROBAN) 2 % Apply 1 application topically 2 (two) times daily. 22 g Raylene Everts, MD      PDMP not reviewed this encounter.   Raylene Everts, MD 04/29/21 1218

## 2021-05-07 DIAGNOSIS — G510 Bell's palsy: Secondary | ICD-10-CM | POA: Diagnosis not present

## 2021-05-07 DIAGNOSIS — Z961 Presence of intraocular lens: Secondary | ICD-10-CM | POA: Diagnosis not present

## 2021-05-07 DIAGNOSIS — H25812 Combined forms of age-related cataract, left eye: Secondary | ICD-10-CM | POA: Diagnosis not present

## 2021-05-07 DIAGNOSIS — I10 Essential (primary) hypertension: Secondary | ICD-10-CM | POA: Diagnosis not present

## 2021-05-07 DIAGNOSIS — Z79899 Other long term (current) drug therapy: Secondary | ICD-10-CM | POA: Diagnosis not present

## 2021-05-07 DIAGNOSIS — M5030 Other cervical disc degeneration, unspecified cervical region: Secondary | ICD-10-CM | POA: Diagnosis not present

## 2021-05-07 DIAGNOSIS — Z9841 Cataract extraction status, right eye: Secondary | ICD-10-CM | POA: Diagnosis not present

## 2021-07-09 DIAGNOSIS — H353121 Nonexudative age-related macular degeneration, left eye, early dry stage: Secondary | ICD-10-CM | POA: Diagnosis not present

## 2021-07-09 DIAGNOSIS — H353112 Nonexudative age-related macular degeneration, right eye, intermediate dry stage: Secondary | ICD-10-CM | POA: Diagnosis not present

## 2021-07-09 DIAGNOSIS — H35373 Puckering of macula, bilateral: Secondary | ICD-10-CM | POA: Diagnosis not present

## 2021-07-09 DIAGNOSIS — H35413 Lattice degeneration of retina, bilateral: Secondary | ICD-10-CM | POA: Diagnosis not present

## 2021-08-11 DIAGNOSIS — L570 Actinic keratosis: Secondary | ICD-10-CM | POA: Diagnosis not present

## 2021-08-11 DIAGNOSIS — X32XXXD Exposure to sunlight, subsequent encounter: Secondary | ICD-10-CM | POA: Diagnosis not present

## 2021-08-11 DIAGNOSIS — Z1283 Encounter for screening for malignant neoplasm of skin: Secondary | ICD-10-CM | POA: Diagnosis not present

## 2021-08-11 DIAGNOSIS — D225 Melanocytic nevi of trunk: Secondary | ICD-10-CM | POA: Diagnosis not present

## 2021-08-13 DIAGNOSIS — H26491 Other secondary cataract, right eye: Secondary | ICD-10-CM | POA: Diagnosis not present

## 2021-11-11 ENCOUNTER — Encounter: Payer: Self-pay | Admitting: Emergency Medicine

## 2021-11-11 ENCOUNTER — Emergency Department
Admission: EM | Admit: 2021-11-11 | Discharge: 2021-11-11 | Disposition: A | Discharge status: Home / Self Care | Payer: Medicare Other | Attending: Family Medicine | Admitting: Family Medicine

## 2021-11-11 DIAGNOSIS — S6990XA Unspecified injury of unspecified wrist, hand and finger(s), initial encounter: Secondary | ICD-10-CM

## 2021-11-11 DIAGNOSIS — S6991XA Unspecified injury of right wrist, hand and finger(s), initial encounter: Secondary | ICD-10-CM | POA: Diagnosis not present

## 2021-11-11 NOTE — ED Provider Notes (Signed)
[name]    CSN: [id number] Arrival date & time: 11/11/21  0944      History   Chief Complaint Chief Complaint  Patient presents with   Numbness    Right middle finger    HPI Yesenia Fontenette is a 76 y.o. male.   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  He has some numbness in his forearm that is fairly steady.  He has had epidural steroids in the past.  He states that at times he gets numbness in his fingers but he can change position it comes back to normal feeling quickly. He is here today because he had a known long finger on his right hand that lasted longer than usual.  When he got out of the shower he noticed it was also quite at the tip.  It has come back to a normal pink color.  He is worried if there is something wrong with his circulation as well as the nerve.  At the current time his hand feels normal.  Good strength.  Good dexterity.  Past Medical History:  Diagnosis Date   Borderline hypertension    BPH (benign prostatic hyperplasia)    Diverticulosis of sigmoid colon    Elevated PSA    H/O Bell's palsy    04/ 2012  right side   Hx of adenomatous colonic polyps    05/ 2017  tubular adenoma   Hypertension    Nocturia    Prostate cancer [facility])     Patient Active Problem List   Diagnosis Date Noted   Prostate cancer ([place]) 06/04/2016   Hx of adenomatous colonic polyps 09/12/2015    Past Surgical History:  Procedure Laterality Date   CATARACT EXTRACTION     COLONOSCOPY  last one 09-11-2015   LYMPHADENECTOMY Bilateral 07/05/2016   Procedure: PELVIC LYMPHADENECTOMY;  Surgeon: Raynelle Bring, MD;  [facility];  Service: Urology;  Laterality: Bilateral;   PROSTATE BIOPSY  10/14/2011   Procedure: BIOPSY TRANSRECTAL ULTRASONIC PROSTATE (TUBP);  Surgeon: Ailene Rud, MD;  Location: [facility];  Service: Urology;  Laterality: N/A;        PROSTATE BIOPSY N/A 05/07/2016   Procedure: SATURATED PROSTATE BIOPSY;  Surgeon: Carolan Clines, MD;  Location: [facility];  Service: Urology;  Laterality: N/A;   ROBOT ASSISTED LAPAROSCOPIC RADICAL PROSTATECTOMY N/A 07/05/2016   Procedure: XI ROBOTIC ASSISTED LAPAROSCOPIC RADICAL PROSTATECTOMY LEVEL 2;  Surgeon: Raynelle Bring, MD;  [facility];  Service: Urology;  Laterality: N/A;   TRANSTHORACIC ECHOCARDIOGRAM  07/26/2010   NORMAL LVSF/ EF 06-65%    TRANSURETHRAL RESECTION OF PROSTATE  2010       Home Medications    Prior to Admission medications   Medication Sig Start Date End Date Taking? Authorizing Provider  Ascorbic Acid (VITAMIN C) 1000 MG tablet Take 1,000 mg by mouth daily.   Yes [provider]  gabapentin (NEURONTIN) 100 MG capsule  09/24/20  Yes [provider]  cetirizine (ZYRTEC) 10 MG tablet Take by mouth.    [provider]  Cholecalciferol 25 MCG (1000 UT) tablet Take by mouth.    [provider]  cyanocobalamin 1000 MCG tablet Take 1 tablet by mouth daily.    [provider]  hydrochlorothiazide (HYDRODIURIL) 25 MG tablet Take 25 mg by mouth daily.     [provider]  Multiple Vitamins-Minerals (PRESERVISION AREDS  2) CAPS Take by mouth.    [provider]  mupirocin ointment (BACTROBAN) 2 % Apply 1 application topically 2 (two) times daily. Patient not taking: Reported on 11/11/2021 04/29/21   Raylene Everts, MD  Polyethyl Glycol-Propyl Glycol (LUBRICANT EYE DROPS, PF, OP) Place 1-2 drops into both eyes 3 (three) times daily as needed (for dry eyes).    [provider]  psyllium (METAMUCIL) 58.6 % packet Take 1 packet by mouth daily as needed (for regularity/constipation).    [provider]  sodium chloride (OCEAN) 0.65 % SOLN nasal spray Place 1 spray into both nostrils as needed for congestion.    [provider]    Family History Family History   Problem Relation Age of Onset   Stroke Mother    Lung disease Father    Cancer Sister        lung smoker   Cancer Paternal Uncle        colon/questionable   Cancer Sister        lung smoker   Cancer Other        brain   Colon cancer Neg Hx    Esophageal cancer Neg Hx    Pancreatic cancer Neg Hx    Prostate cancer Neg Hx    Rectal cancer Neg Hx    Stomach cancer Neg Hx     Social History Social History   Tobacco Use   Smoking status: Never   Smokeless tobacco: Never  Vaping Use   Vaping Use: Never used  Substance Use Topics   Alcohol use: No    Alcohol/week: 0.0 standard drinks of alcohol   Drug use: No     Allergies   Patient has no known allergies.   Review of Systems Review of Systems See HPI  Physical Exam Triage Vital Signs ED Triage Vitals  Enc Vitals Group     BP 11/11/21 1006 (!) 155/85     Pulse Rate 11/11/21 1006 75     Resp 11/11/21 1006 16     Temp 11/11/21 1006 98.3 F (36.8 C)     Temp Source 11/11/21 1006 Oral     SpO2 11/11/21 1006 97 %     Weight 11/11/21 1009 169 lb (76.7 kg)     Height 11/11/21 1009 '5\' 8"'$  (1.727 m)     Head Circumference --      Peak Flow --      Pain Score 11/11/21 1009 0     Pain Loc --      Pain Edu? --      Excl. in [place]? --    No data found.  Updated Vital Signs BP (!) 155/85 (BP Location: Left Arm)   Pulse 75   Temp 98.3 F (36.8 C) (Oral)   Resp 16   Ht '5\' 8"'$  (1.727 m)   Wt 76.7 kg   SpO2 97%   BMI 25.70 kg/m       Physical Exam Constitutional:      General: He is not in acute distress.    Appearance: Normal appearance. He is well-developed and normal weight.  HENT:     Head: Normocephalic and atraumatic.  Eyes:     Conjunctiva/sclera: Conjunctivae normal.     Pupils: Pupils are equal, round, and reactive to light.  Cardiovascular:     Rate and Rhythm: Normal rate.  Pulmonary:     Effort: Pulmonary effort is normal. No respiratory distress.  Abdominal:     General: There is  no  distension.     Palpations: Abdomen is soft.  Musculoskeletal:        General: Swelling present. Normal range of motion.     Cervical back: Normal range of motion.     Comments: Both hands examined.  The long finger of the right hand has slight swelling as opposed to the left.  There is also bruising noted on the lateral aspect of the finger and around the palmar aspect of the finger proximally.  Cap refills normal.  Sensations normal.  Grip strength is good  Skin:    General: Skin is warm and dry.  Neurological:     General: No focal deficit present.     Mental Status: He is alert.     Sensory: No sensory deficit.     Motor: No weakness.     Coordination: Coordination normal.  Psychiatric:        Mood and Affect: Mood normal.        Behavior: Behavior normal.      UC Treatments / Results  Labs (all labs ordered are listed, but only abnormal results are displayed) Labs Reviewed - No data to display  EKG   Radiology No results found.  Procedures Procedures (including critical care time)  Medications Ordered in UC Medications - No data to display  Initial Impression / Assessment and Plan / UC Course  I have reviewed the triage vital signs and the nursing notes.  Pertinent labs & imaging results that were available during my care of the patient were reviewed by me and considered in my medical decision making (see chart for details).     We discussed cervical disc disease.  The numbness in his forearm is not at all disabling for him and can be ignored.  The intermittent symptoms in his fingers seem to be activity and positional.  He wonders if there is any additional treatment and I told him that other than surgery, physical therapy may help.  He also questions chiropractic care and acupuncture.  I do not know that a chiropractor be appropriate with his multilevel disc disease and osteophytes.  He will follow-up with his primary care doctor Final Clinical Impressions(s) / UC  Diagnoses   Final diagnoses:  Finger injury, initial encounter     Discharge Instructions      Return if needed   ED Prescriptions   None    PDMP not reviewed this encounter.   Raylene Everts, MD 11/11/21 1059

## 2021-11-11 NOTE — Discharge Instructions (Signed)
Return if needed

## 2021-11-11 NOTE — ED Triage Notes (Signed)
Pt was using a chain saw yesterday  Hx of pinched nerve causes his right hand to go numb intermittently Pt states his  right middle finger went numb & turned pale after his shower  Numbness lasted for about 90 min Bruise noted to inside of finger

## 2021-12-14 DIAGNOSIS — H353112 Nonexudative age-related macular degeneration, right eye, intermediate dry stage: Secondary | ICD-10-CM | POA: Diagnosis not present

## 2021-12-14 DIAGNOSIS — H43813 Vitreous degeneration, bilateral: Secondary | ICD-10-CM | POA: Diagnosis not present

## 2021-12-14 DIAGNOSIS — H35373 Puckering of macula, bilateral: Secondary | ICD-10-CM | POA: Diagnosis not present

## 2021-12-14 DIAGNOSIS — H353121 Nonexudative age-related macular degeneration, left eye, early dry stage: Secondary | ICD-10-CM | POA: Diagnosis not present

## 2022-01-12 DIAGNOSIS — H35341 Macular cyst, hole, or pseudohole, right eye: Secondary | ICD-10-CM | POA: Diagnosis not present

## 2022-01-12 DIAGNOSIS — H35371 Puckering of macula, right eye: Secondary | ICD-10-CM | POA: Diagnosis not present

## 2022-01-19 DIAGNOSIS — H35371 Puckering of macula, right eye: Secondary | ICD-10-CM | POA: Diagnosis not present

## 2022-02-15 DIAGNOSIS — H35373 Puckering of macula, bilateral: Secondary | ICD-10-CM | POA: Diagnosis not present

## 2022-02-15 DIAGNOSIS — H353131 Nonexudative age-related macular degeneration, bilateral, early dry stage: Secondary | ICD-10-CM | POA: Diagnosis not present

## 2022-02-15 DIAGNOSIS — Z961 Presence of intraocular lens: Secondary | ICD-10-CM | POA: Diagnosis not present

## 2022-02-15 DIAGNOSIS — H16223 Keratoconjunctivitis sicca, not specified as Sjogren's, bilateral: Secondary | ICD-10-CM | POA: Diagnosis not present

## 2022-02-16 DIAGNOSIS — H353121 Nonexudative age-related macular degeneration, left eye, early dry stage: Secondary | ICD-10-CM | POA: Diagnosis not present

## 2022-02-16 DIAGNOSIS — H353112 Nonexudative age-related macular degeneration, right eye, intermediate dry stage: Secondary | ICD-10-CM | POA: Diagnosis not present

## 2022-02-16 DIAGNOSIS — H35373 Puckering of macula, bilateral: Secondary | ICD-10-CM | POA: Diagnosis not present

## 2022-02-26 DIAGNOSIS — L814 Other melanin hyperpigmentation: Secondary | ICD-10-CM | POA: Diagnosis not present

## 2022-02-26 DIAGNOSIS — L821 Other seborrheic keratosis: Secondary | ICD-10-CM | POA: Diagnosis not present

## 2022-02-26 DIAGNOSIS — D225 Melanocytic nevi of trunk: Secondary | ICD-10-CM | POA: Diagnosis not present

## 2022-02-26 DIAGNOSIS — D485 Neoplasm of uncertain behavior of skin: Secondary | ICD-10-CM | POA: Diagnosis not present

## 2022-05-06 DIAGNOSIS — H353132 Nonexudative age-related macular degeneration, bilateral, intermediate dry stage: Secondary | ICD-10-CM | POA: Diagnosis not present

## 2022-05-06 DIAGNOSIS — H35033 Hypertensive retinopathy, bilateral: Secondary | ICD-10-CM | POA: Diagnosis not present

## 2022-05-06 DIAGNOSIS — H35372 Puckering of macula, left eye: Secondary | ICD-10-CM | POA: Diagnosis not present

## 2022-05-06 DIAGNOSIS — H31093 Other chorioretinal scars, bilateral: Secondary | ICD-10-CM | POA: Diagnosis not present

## 2023-02-11 ENCOUNTER — Other Ambulatory Visit: Payer: Self-pay

## 2023-02-11 ENCOUNTER — Ambulatory Visit
Admission: EM | Admit: 2023-02-11 | Discharge: 2023-02-11 | Disposition: A | Discharge status: Home / Self Care | Payer: Medicare Other | Attending: Family Medicine | Admitting: Family Medicine

## 2023-02-11 DIAGNOSIS — L84 Corns and callosities: Secondary | ICD-10-CM

## 2023-02-11 MED ORDER — MUPIROCIN 2 % EX OINT: TOPICAL_OINTMENT | CUTANEOUS | 0 refills | Status: AC

## 2023-02-11 MED ORDER — UREA 40 % EX CREA: TOPICAL_CREAM | CUTANEOUS | 0 refills | Status: AC

## 2023-02-11 NOTE — ED Provider Notes (Signed)
[facility]    CSN: [id number] Arrival date & time: 02/11/23  1406      History   Chief Complaint Chief Complaint  Patient presents with   Toe Pain    HPI Jeremy Ruiz is a 77 y.o. male.   Patient complains of a corn on his left 4th toe for several weeks.  He has been using corn plasters without improvement. [facility].  Since then he has developed mild increased soreness around the corn.   The history is provided by the patient.  Toe Pain This is a new problem. The current episode started more than 1 week ago. The problem occurs constantly. The problem has not changed since onset.The symptoms are aggravated by walking. Nothing relieves the symptoms.    Past Medical History:  Diagnosis Date   Borderline hypertension    BPH (benign prostatic hyperplasia)    Diverticulosis of sigmoid colon    Elevated PSA    H/O Bell's palsy    04/ 2012  right side   Hx of adenomatous colonic polyps    05/ 2017  tubular adenoma   Hypertension    Nocturia    Prostate cancer [facility])     Patient Active Problem List   Diagnosis Date Noted   Prostate cancer (HCC) 06/04/2016   Hx of adenomatous colonic polyps 09/12/2015    Past Surgical History:  Procedure Laterality Date   CATARACT EXTRACTION     COLONOSCOPY  last one 09-11-2015   LYMPHADENECTOMY Bilateral 07/05/2016   Procedure: PELVIC LYMPHADENECTOMY;  Surgeon: Heloise Purpura, MD;  Location: [facility];  Service: Urology;  Laterality: Bilateral;   PROSTATE BIOPSY  10/14/2011   Procedure: BIOPSY TRANSRECTAL ULTRASONIC PROSTATE (TUBP);  Surgeon: Kathi Ludwig, MD;  Location: [facility];  Service: Urology;  Laterality: N/A;       PROSTATE BIOPSY N/A 05/07/2016   Procedure: SATURATED PROSTATE BIOPSY;  Surgeon: Jethro Bolus, MD;  Location: [facility];  Service: Urology;  Laterality: N/A;   ROBOT ASSISTED LAPAROSCOPIC  RADICAL PROSTATECTOMY N/A 07/05/2016   Procedure: XI ROBOTIC ASSISTED LAPAROSCOPIC RADICAL PROSTATECTOMY LEVEL 2;  Surgeon: Heloise Purpura, MD;  Location: [facility];  Service: Urology;  Laterality: N/A;   TRANSTHORACIC ECHOCARDIOGRAM  07/26/2010   NORMAL LVSF/ EF 06-65%    TRANSURETHRAL RESECTION OF PROSTATE  2010       Home Medications    Prior to Admission medications   Medication Sig Start Date End Date Taking? Authorizing Provider  losartan (COZAAR) 50 MG tablet Take 50 mg by mouth daily.   Yes [provider]  urea (CARMOL) 40 % CREA Apply small amount to corn twice daily 02/11/23  Yes Sears Oran, Tera Mater, MD  Ascorbic Acid (VITAMIN C) 1000 MG tablet Take 1,000 mg by mouth daily.    [provider]  cetirizine (ZYRTEC) 10 MG tablet Take by mouth.    [provider]  Cholecalciferol 25 MCG (1000 UT) tablet Take by mouth.    [provider]  cyanocobalamin 1000 MCG tablet Take 1 tablet by mouth daily.    [provider]  hydrochlorothiazide (HYDRODIURIL) 25 MG tablet Take 25 mg by mouth daily.     [provider]  Multiple Vitamins-Minerals (PRESERVISION AREDS 2) CAPS Take by mouth.    [provider]  mupirocin ointment (BACTROBAN) 2 % Apply twice daily for skin infection 02/11/23   Lattie Haw, MD  Family History Family History  Problem Relation Age of Onset   Stroke Mother    Lung disease Father    Cancer Sister        lung smoker   Cancer Paternal Uncle        colon/questionable   Cancer Sister        lung smoker   Cancer Other        brain   Colon cancer Neg Hx    Esophageal cancer Neg Hx    Pancreatic cancer Neg Hx    Prostate cancer Neg Hx    Rectal cancer Neg Hx    Stomach cancer Neg Hx     Social History Social History   Tobacco Use   Smoking status: Never   Smokeless tobacco: Never  Vaping Use   Vaping status: Never Used  Substance Use Topics   Alcohol use: No    Alcohol/week: 0.0  standard drinks of alcohol   Drug use: No     Allergies   Patient has no known allergies.   Review of Systems Review of Systems  Constitutional:  Negative for chills, diaphoresis and fever.  Skin:  Positive for color change. Negative for wound.  All other systems reviewed and are negative.    Physical Exam Triage Vital Signs ED Triage Vitals  Encounter Vitals Group     BP 02/11/23 1415 126/79     Systolic BP Percentile --      Diastolic BP Percentile --      Pulse Rate 02/11/23 1415 96     Resp 02/11/23 1415 16     Temp 02/11/23 1415 (!) 97.5 F (36.4 C)     Temp Source 02/11/23 1415 Oral     SpO2 02/11/23 1415 99 %     Weight --      Height --      Head Circumference --      Peak Flow --      Pain Score 02/11/23 1417 2     Pain Loc --      Pain Education --      Exclude from Growth Chart --    No data found.  Updated Vital Signs BP 126/79   Pulse 96   Temp (!) 97.5 F (36.4 C) (Oral)   Resp 16   SpO2 99%   Visual Acuity Right Eye Distance:   Left Eye Distance:   Bilateral Distance:    Right Eye Near:   Left Eye Near:    Bilateral Near:     Physical Exam Vitals and nursing note reviewed.  Constitutional:      General: He is not in acute distress. Eyes:     Pupils: Pupils are equal, round, and reactive to light.  Cardiovascular:     Rate and Rhythm: Normal rate.  Pulmonary:     Effort: Pulmonary effort is normal.  Musculoskeletal:       Feet:     Comments: On the lateral aspect of the left 4th toe there is a 6mm diameter corn present with surrounding erythema.  There is no swelling and minimal tenderness to palpation.  Sensation is intact.  Skin:    General: Skin is warm and dry.  Neurological:     Mental Status: He is alert.      UC Treatments / Results  Labs (all labs ordered are listed, but only abnormal results are displayed) Labs Reviewed - No data to display  EKG   Radiology No results found.  Procedures Procedures  (including critical care time)  Medications Ordered in UC Medications - No data to display  Initial Impression / Assessment and Plan / UC Course  I have reviewed the triage vital signs and the nursing notes.  Pertinent labs & imaging results that were available during my care of the patient were reviewed by me and considered in my medical decision making (see chart for details).    Begin mupirocin ointment BID for one week, then begin 40% urea cream BID until size of corn decreased. Followup with podiatrist if not improved one month.  Final Clinical Impressions(s) / UC Diagnoses   Final diagnoses:  Corn infected     Discharge Instructions      Use antibiotic ointment for one week, then begin medicated cream.  Apply a bandage after each application of cream or ointment. Followup with family doctor if increased swelling or pain develop.    ED Prescriptions     Medication Sig Dispense Auth. Provider   mupirocin ointment (BACTROBAN) 2 % Apply twice daily for skin infection 15 g Lattie Haw, MD   urea (CARMOL) 40 % CREA Apply small amount to corn twice daily 28.35 g Lattie Haw, MD         Lattie Haw, MD 02/11/23 [phone]

## 2023-02-11 NOTE — ED Triage Notes (Signed)
C/o corn on left 4th toe. Has used corn pad remover which has not helped.

## 2023-02-11 NOTE — Discharge Instructions (Signed)
Use antibiotic ointment for one week, then begin medicated cream.  Apply a bandage after each application of cream or ointment. Followup with family doctor if increased swelling or pain develop.

## 2023-02-15 DIAGNOSIS — D225 Melanocytic nevi of trunk: Secondary | ICD-10-CM | POA: Diagnosis not present

## 2023-02-15 DIAGNOSIS — Z1283 Encounter for screening for malignant neoplasm of skin: Secondary | ICD-10-CM | POA: Diagnosis not present

## 2023-03-09 DIAGNOSIS — R059 Cough, unspecified: Secondary | ICD-10-CM | POA: Diagnosis not present

## 2024-01-13 DIAGNOSIS — R053 Chronic cough: Secondary | ICD-10-CM | POA: Diagnosis not present

## 2024-02-25 DIAGNOSIS — Z23 Encounter for immunization: Secondary | ICD-10-CM | POA: Diagnosis not present
